# Patient Record
Sex: Male | Born: 1965 | Race: White | Hispanic: No | Smoking: Never smoker
Health system: Southern US, Community
[De-identification: ages and names within clinical notes are randomized; demographics above are authoritative.]

## PROBLEM LIST (undated history)

## (undated) DIAGNOSIS — I10 Essential (primary) hypertension: Secondary | ICD-10-CM

## (undated) DIAGNOSIS — E119 Type 2 diabetes mellitus without complications: Secondary | ICD-10-CM

## (undated) DIAGNOSIS — G4730 Sleep apnea, unspecified: Secondary | ICD-10-CM

## (undated) HISTORY — DX: Sleep apnea, unspecified: G47.30

## (undated) HISTORY — PX: ANKLE FRACTURE SURGERY: SHX122

## (undated) HISTORY — PX: OTHER SURGICAL HISTORY: SHX169

## (undated) HISTORY — DX: Essential (primary) hypertension: I10

## (undated) HISTORY — DX: Type 2 diabetes mellitus without complications: E11.9

---

## 2008-09-08 ENCOUNTER — Inpatient Hospital Stay (HOSPITAL_COMMUNITY): Admission: EM | Admit: 2008-09-08 | Discharge: 2008-09-11 | Payer: Self-pay | Admitting: Emergency Medicine

## 2008-09-09 ENCOUNTER — Ambulatory Visit: Payer: Self-pay | Admitting: Orthopedic Surgery

## 2008-09-10 ENCOUNTER — Encounter: Payer: Self-pay | Admitting: Orthopedic Surgery

## 2008-09-13 ENCOUNTER — Encounter: Payer: Self-pay | Admitting: Orthopedic Surgery

## 2008-09-19 ENCOUNTER — Encounter: Payer: Self-pay | Admitting: Orthopedic Surgery

## 2008-09-21 ENCOUNTER — Ambulatory Visit: Payer: Self-pay | Admitting: Orthopedic Surgery

## 2008-09-21 DIAGNOSIS — S82409A Unspecified fracture of shaft of unspecified fibula, initial encounter for closed fracture: Secondary | ICD-10-CM | POA: Insufficient documentation

## 2008-09-28 ENCOUNTER — Telehealth: Payer: Self-pay | Admitting: Orthopedic Surgery

## 2008-10-19 ENCOUNTER — Ambulatory Visit: Payer: Self-pay | Admitting: Orthopedic Surgery

## 2008-10-19 DIAGNOSIS — Z8679 Personal history of other diseases of the circulatory system: Secondary | ICD-10-CM | POA: Insufficient documentation

## 2008-10-24 ENCOUNTER — Ambulatory Visit (HOSPITAL_COMMUNITY): Admission: RE | Admit: 2008-10-24 | Discharge: 2008-10-24 | Payer: Self-pay | Admitting: Orthopedic Surgery

## 2008-10-24 ENCOUNTER — Ambulatory Visit: Payer: Self-pay | Admitting: Orthopedic Surgery

## 2008-10-26 ENCOUNTER — Ambulatory Visit: Payer: Self-pay | Admitting: Orthopedic Surgery

## 2008-11-06 ENCOUNTER — Ambulatory Visit: Payer: Self-pay | Admitting: Orthopedic Surgery

## 2008-12-04 ENCOUNTER — Ambulatory Visit: Payer: Self-pay | Admitting: Orthopedic Surgery

## 2008-12-13 ENCOUNTER — Telehealth: Payer: Self-pay | Admitting: Orthopedic Surgery

## 2009-01-08 ENCOUNTER — Ambulatory Visit: Payer: Self-pay | Admitting: Orthopedic Surgery

## 2009-01-18 ENCOUNTER — Encounter (INDEPENDENT_AMBULATORY_CARE_PROVIDER_SITE_OTHER): Payer: Self-pay | Admitting: *Deleted

## 2009-01-19 ENCOUNTER — Ambulatory Visit (HOSPITAL_COMMUNITY): Admission: RE | Admit: 2009-01-19 | Discharge: 2009-01-19 | Payer: Self-pay | Admitting: Orthopedic Surgery

## 2009-01-19 ENCOUNTER — Ambulatory Visit: Payer: Self-pay | Admitting: Orthopedic Surgery

## 2009-01-23 ENCOUNTER — Ambulatory Visit: Payer: Self-pay | Admitting: Orthopedic Surgery

## 2009-01-30 ENCOUNTER — Ambulatory Visit: Payer: Self-pay | Admitting: Orthopedic Surgery

## 2009-02-19 ENCOUNTER — Ambulatory Visit: Payer: Self-pay | Admitting: Orthopedic Surgery

## 2009-02-23 ENCOUNTER — Encounter: Payer: Self-pay | Admitting: Orthopedic Surgery

## 2009-03-28 ENCOUNTER — Encounter: Payer: Self-pay | Admitting: Orthopedic Surgery

## 2009-07-04 IMAGING — CR DG ANKLE 2V *L*
2 series · 2 of 2 positions shown · non-contrast
Comparison: None

CLINICAL DATA: Fell on ice at [employer].  Deformity

LEFT ANKLE - 2 VIEW

[view not recorded (1 of 2)]
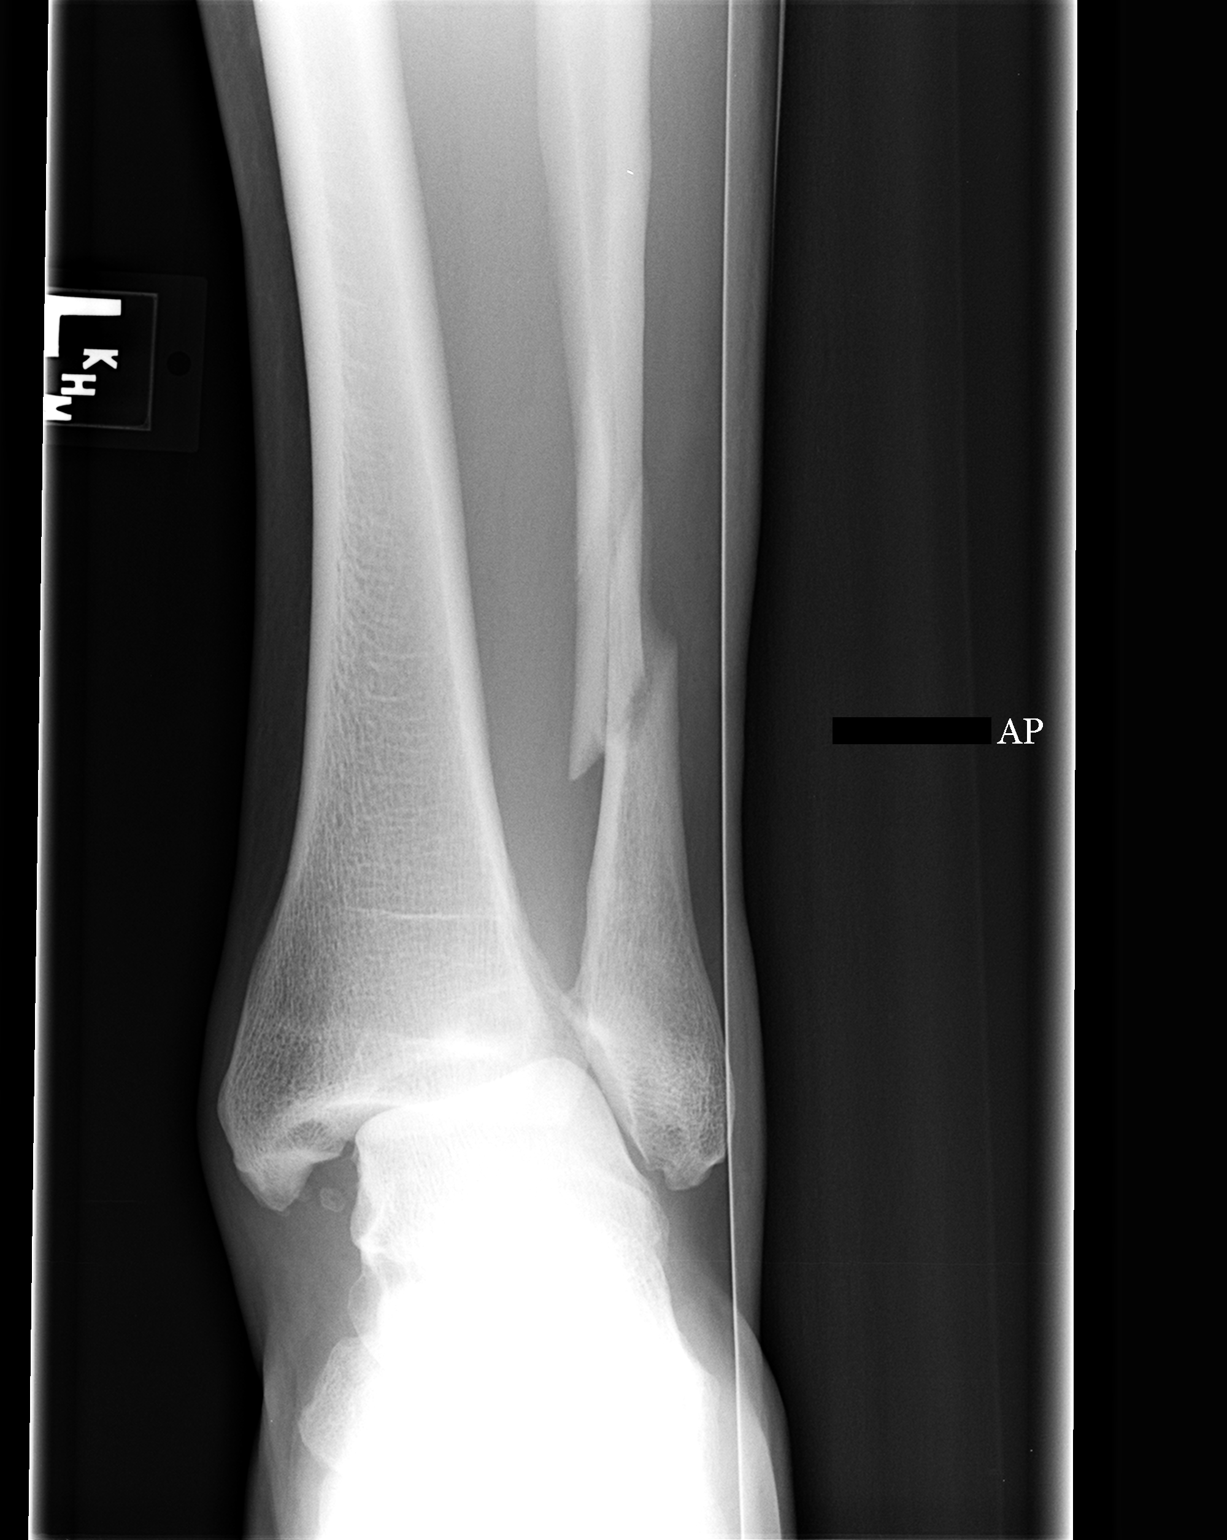

[view not recorded (2 of 2)]
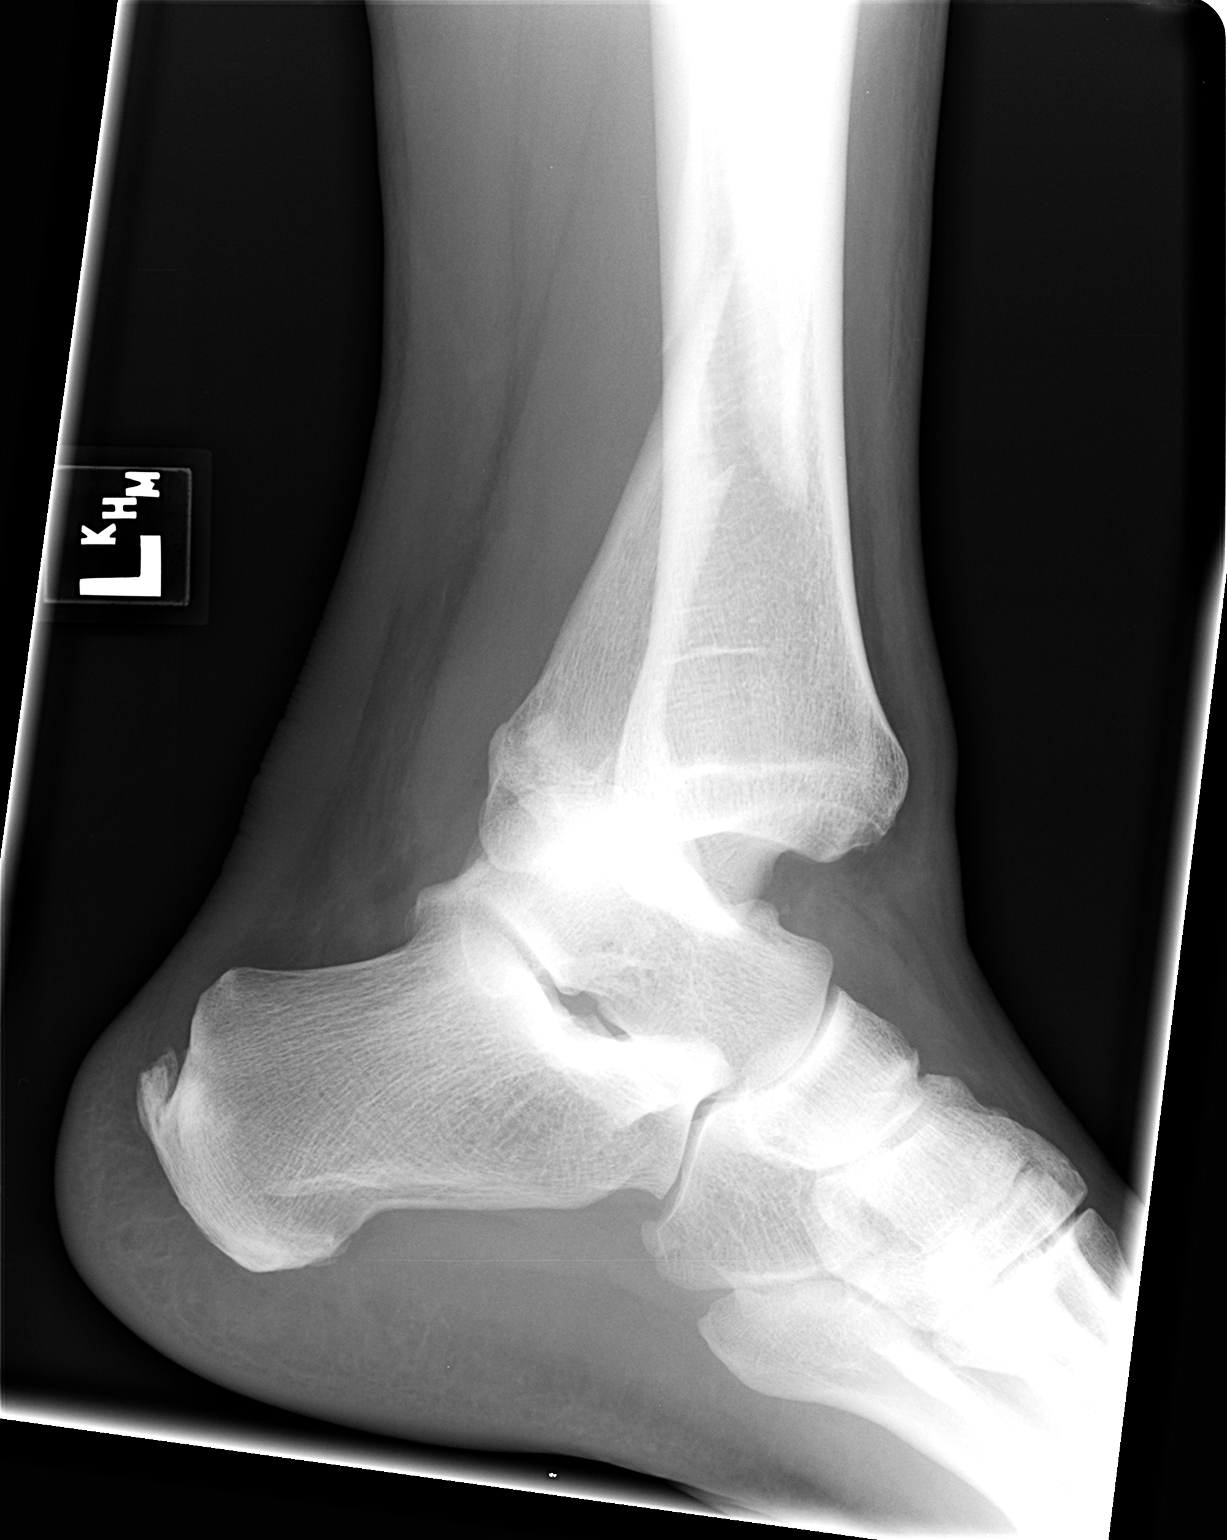

[2 of 2 positions shown; findings below may reference images not displayed]

FINDINGS: There is an oblique, comminuted fracture of the fibula.
There is anterior dislocation of the distal tibia relative to the
talus.  Fracture of the posterior aspect of the tibia cannot be
entirely excluded on the lateral view.  There is significant soft
tissue swelling throughout the ankle. There is an Achilles
calcaneal spur.
IMPRESSION: Fracture dislocation of the ankle.

## 2009-07-04 IMAGING — CR DG ANKLE PORT 2V*L*
2 series · 2 of 2 positions shown · non-contrast
Comparison: Prereduction films of 09/08/2008

CLINICAL DATA: Left ankle injury, post reduction

PORTABLE LEFT ANKLE - 2 VIEW

[view not recorded (1 of 2)]
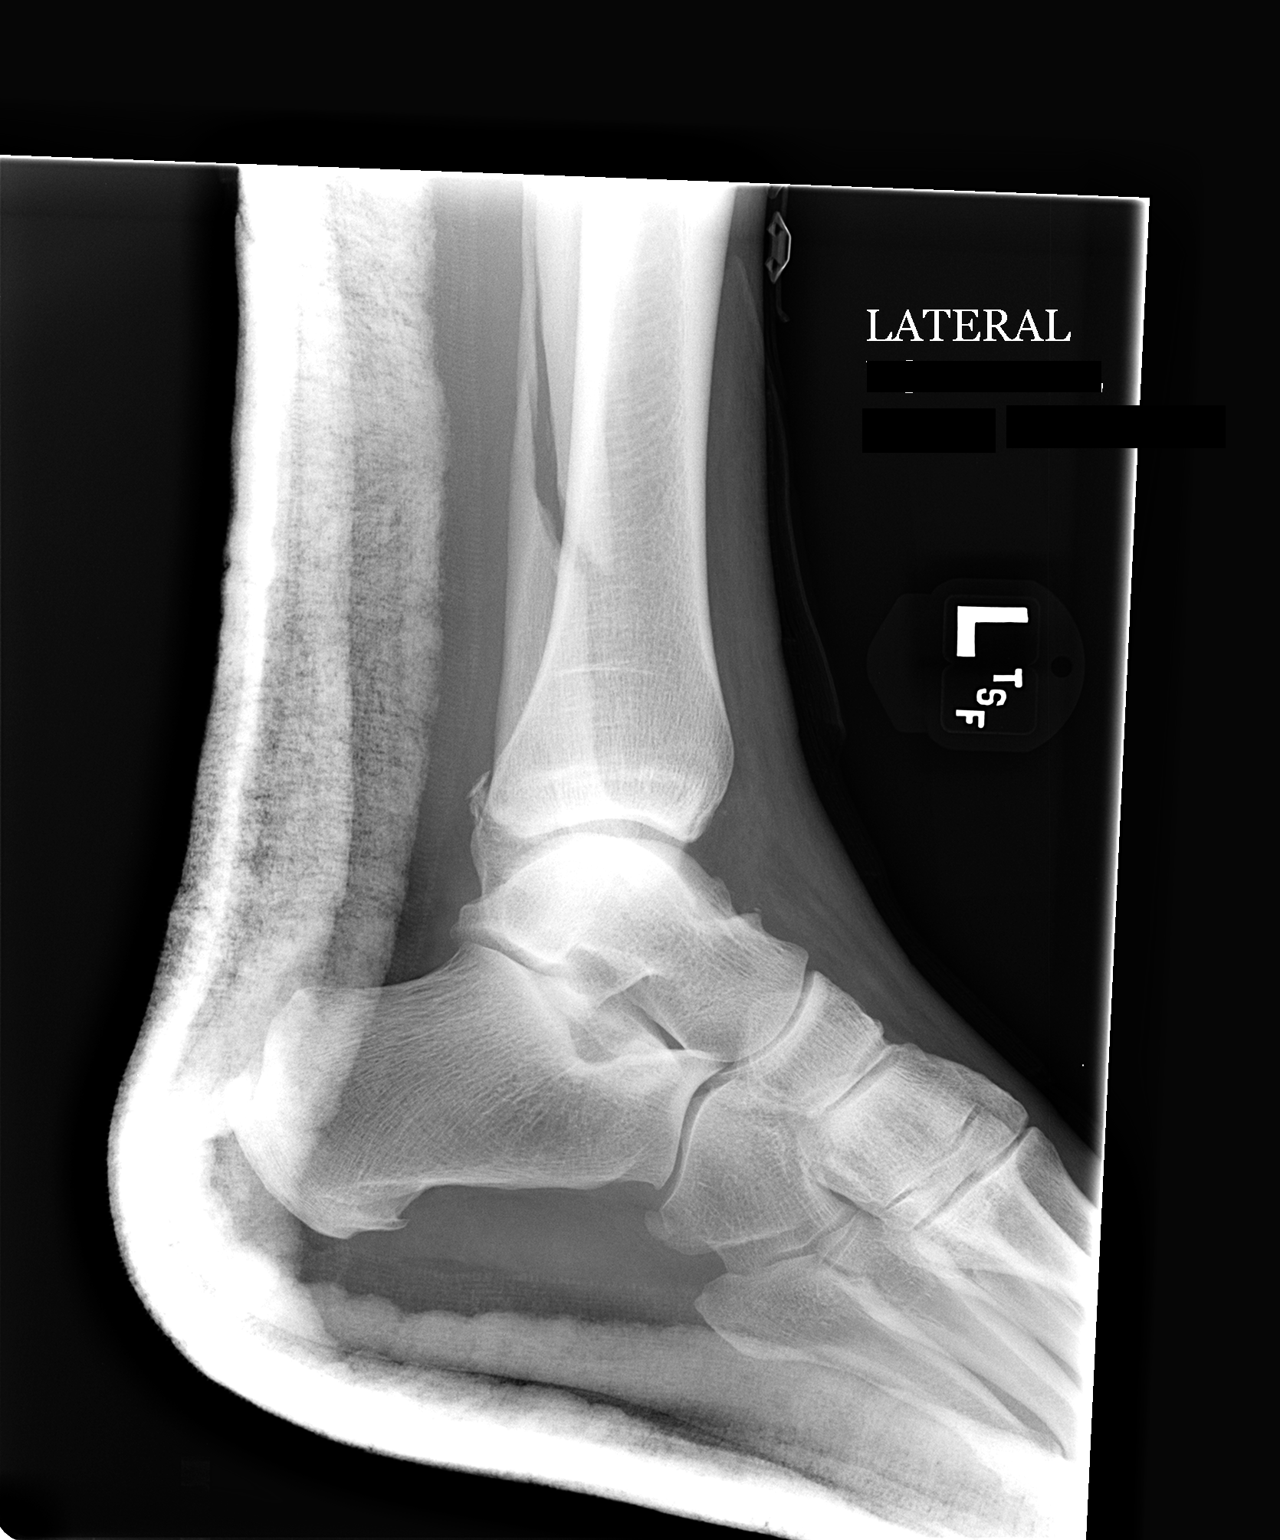

[view not recorded (2 of 2)]
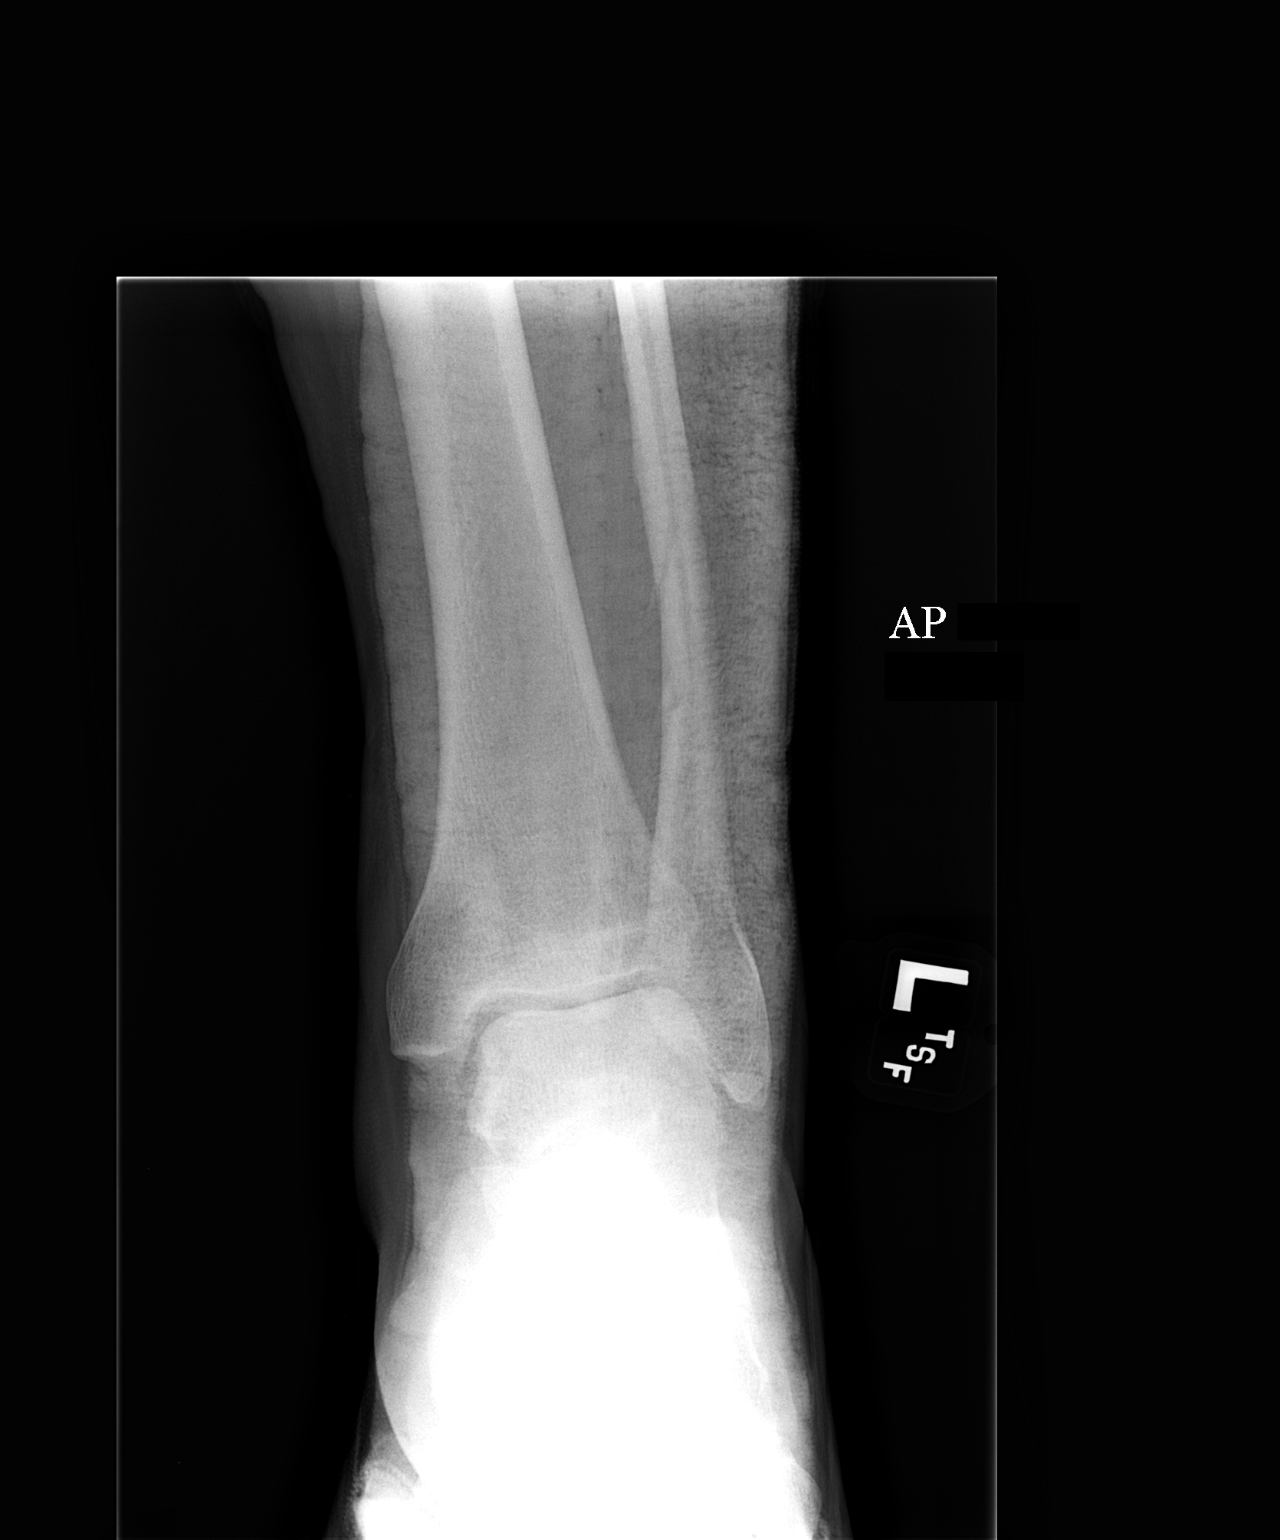

[2 of 2 positions shown; findings below may reference images not displayed]

FINDINGS: The tibiotalar dislocation has been reduced.  An oblique
fracture of the distal left fibula is in better position and
alignment.  The tibiotalar joint space appears normal. There do
appear to be small fracture fragments from the posterior malleolus.
IMPRESSION: Reduction of tibiotalar dislocation with improved position and
alignment of the oblique distal left fibular fracture.
Small fracture fragments from posterior malleolus.

## 2009-11-14 IMAGING — RF DG ANKLE 2V *L*
1 series · 3 of 3 positions shown · non-contrast
Comparison: C-arm spot films of 10/24/2008

CLINICAL DATA: Fixation of prior ankle fracture, for removal of
hardware

LEFT ANKLE - 2 VIEW

[Series 1: run · 3 of 3 slices shown]
[im 1/3]
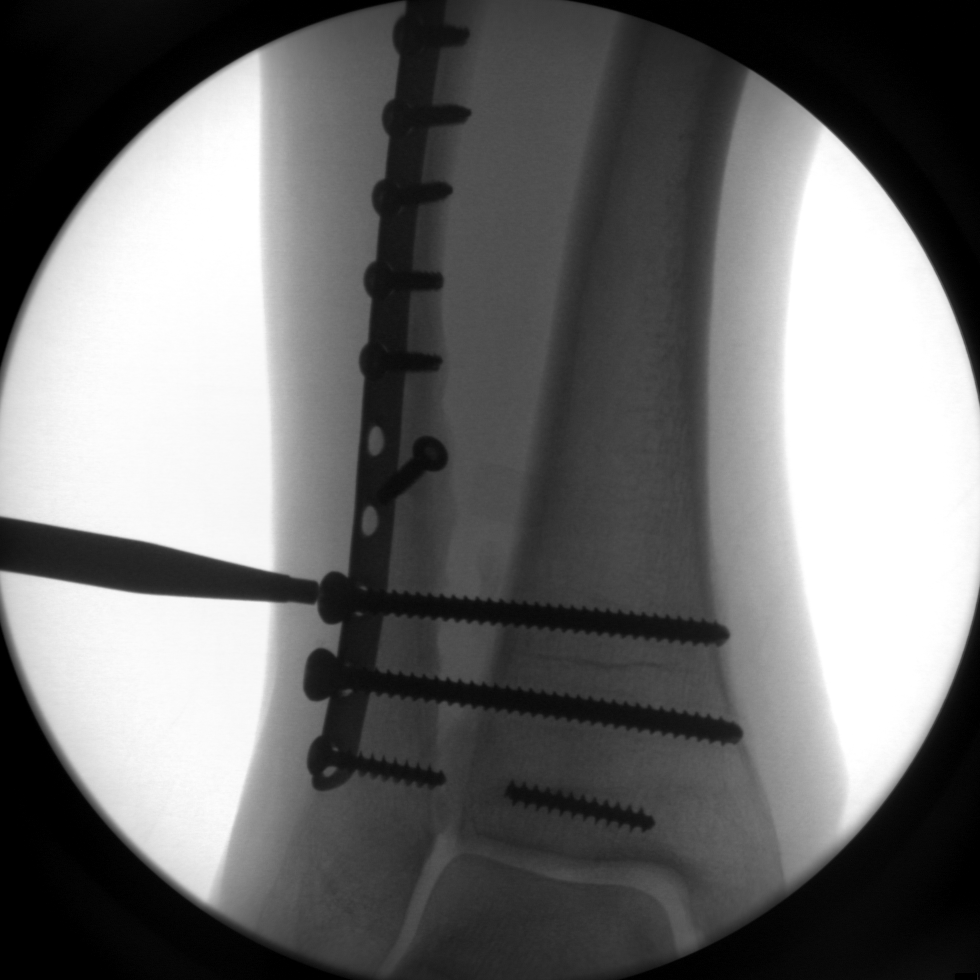
[im 2/3]
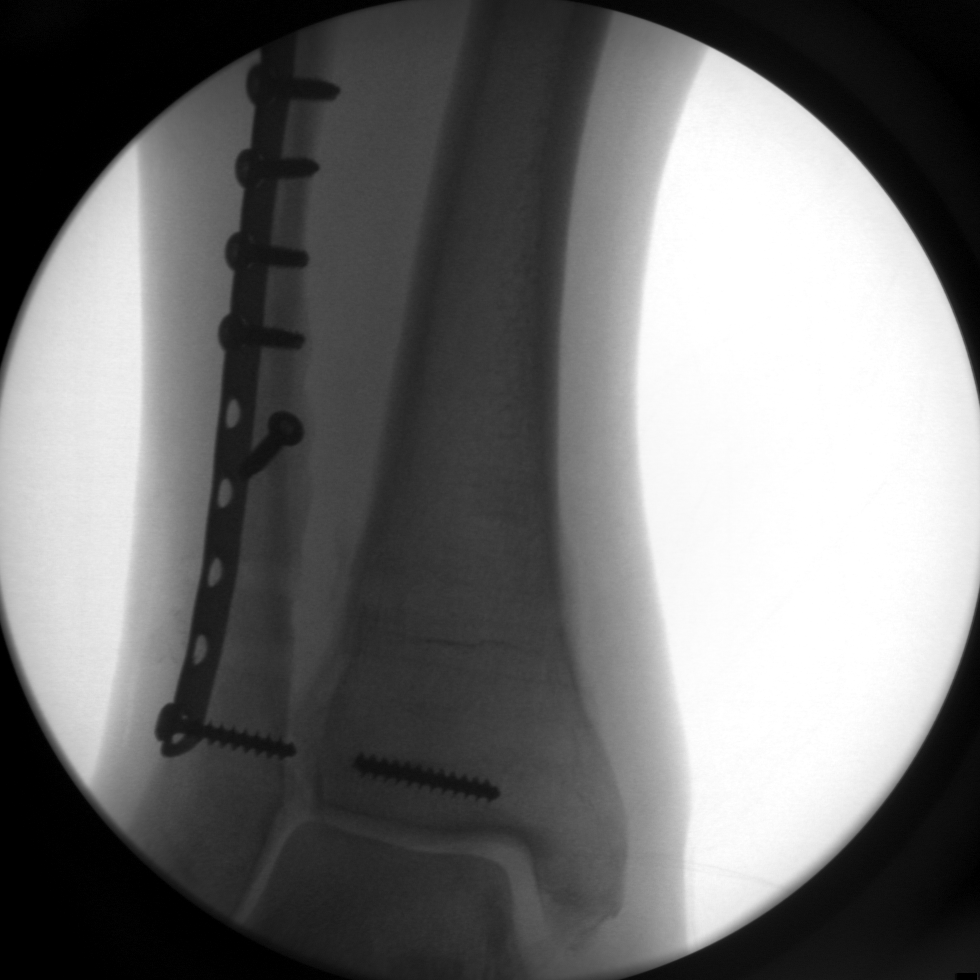
[im 3/3]
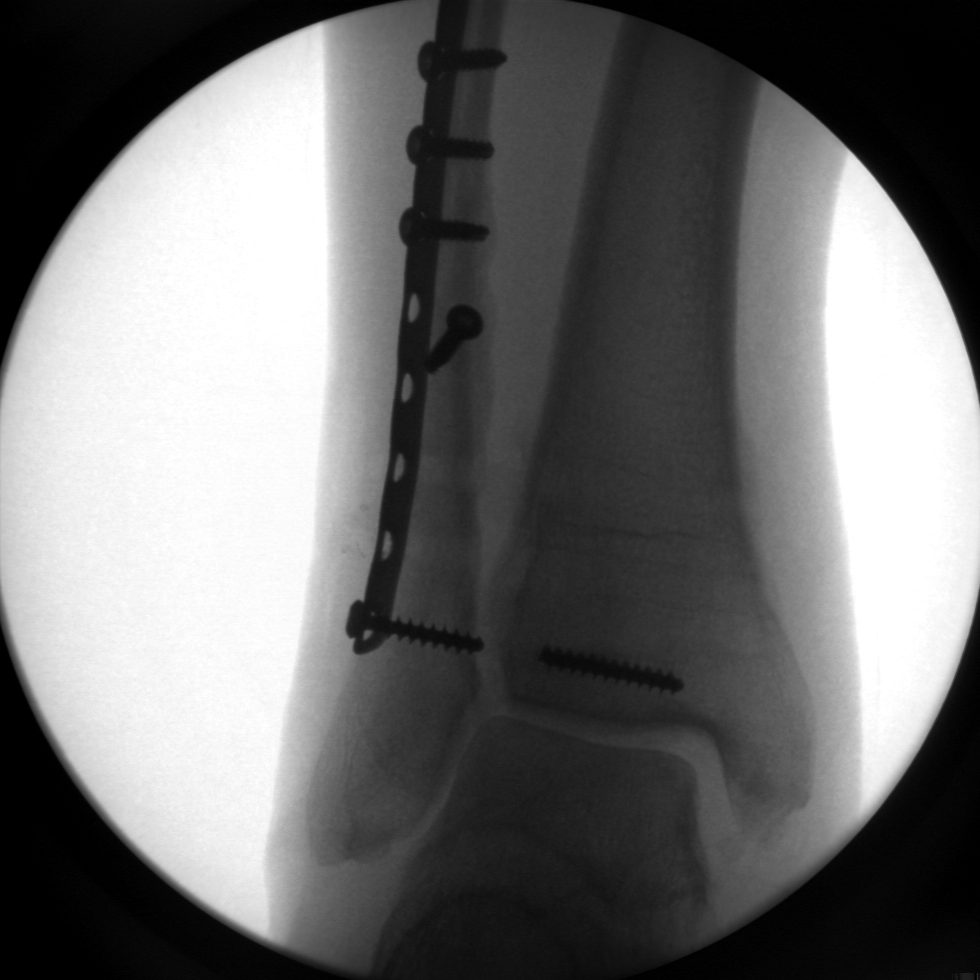

[3 of 3 positions shown; findings below may reference images not displayed]

FINDINGS: The two screws that traversed the distal fibula and tibia
have been removed.  The remainder of the fixation plate and screws,
with the more caudal screw again noted to be fragmented, are
unchanged.
IMPRESSION: Removal of two distal screws which traversed the distal fibula and
tibia.

## 2010-12-10 LAB — BASIC METABOLIC PANEL
BUN: 12 mg/dL (ref 6–23)
GFR calc non Af Amer: >60 mL/min (ref >60)
Potassium: 3.9 mEq/L (ref 3.5–5.1)

## 2010-12-10 LAB — HEMOGLOBIN AND HEMATOCRIT, BLOOD: Hemoglobin: 15.9 g/dL (ref 13.0–17.0)

## 2010-12-16 LAB — BASIC METABOLIC PANEL
BUN: 11 mg/dL (ref 6–23)
Calcium: 9.3 mg/dL (ref 8.4–10.5)
GFR calc non Af Amer: >60 mL/min (ref >60)
Glucose, Bld: 123 mg/dL — ABNORMAL HIGH (ref 70–99)
Sodium: 139 mEq/L (ref 135–145)

## 2010-12-16 LAB — CBC
Hemoglobin: 16.8 g/dL (ref 13.0–17.0)
Platelets: 235 10*3/uL (ref 150–400)
RDW: 13.2 % (ref 11.5–15.5)

## 2010-12-16 LAB — DIFFERENTIAL
Basophils Absolute: 0 10*3/uL (ref 0.0–0.1)
Lymphocytes Relative: 11 % — ABNORMAL LOW (ref 12–46)
Neutro Abs: 9.4 10*3/uL — ABNORMAL HIGH (ref 1.7–7.7)
Neutrophils Relative %: 85 % — ABNORMAL HIGH (ref 43–77)

## 2011-01-14 NOTE — Op Note (Signed)
NAME:  [name], [name] NO.:  [id number]   MEDICAL RECORD NO.:  [id number]          PATIENT TYPE:  AMB   LOCATION:  DAY                           FACILITY:  [facility]   PHYSICIAN:  Vickki Hearing, M.D.DATE OF BIRTH:  10-13-1965   DATE OF PROCEDURE:  DATE OF DISCHARGE:                               OPERATIVE REPORT   This is a 45 year old male who fractured his left ankle on September 09, 2008, at a local store.  He came in for open treatment and internal  fixation of the left fibula with syndesmosis screw and did well until  postop day #40, and on x-ray, he was found to have a broken syndesmosis  screw despite no weightbearing.  He was advised that he return for  revision of his hardware secondary to widening of medial clear space.   PREOPERATIVE DIAGNOSIS:  Fracture left ankle with broken hardware.   POSTOPERATIVE DIAGNOSIS:  Fracture left ankle with broken hardware.   PROCEDURE:  Hardware revision with removal of the broken distal screw,  reinsertion of a 4.0 cancellous screw and then removal of the second to  the last and third to the last cortical screws and placement of two 4.5  fully threaded cortical screws with 4 cortical crutches.   SURGEON:  Vickki Hearing, MD   ASSISTANT:  Fouke Nation.   ANESTHESIA:  General.   OPERATIVE FINDINGS:  The syndesmosis appear to be intact, but the  deltoid ligament appeared to be torn on stress views.   DETAILS:  The patient was identified as Anthony Fry.  Left ankle marked  for surgery.  Surgical site countersigned by surgeon.  The patient taken  to Surgery for general anesthetic.  After sterile prep and drape, time-  out procedure was initiated and completed.   Tourniquet elevated to 350 mmHg.   Using the same incision, the distal portion was opened.  The broken  screw was removed.  The hole was re-measured, and a 4.0 cancellous screw  was placed with good purchase.   The next screw approximately was  removed, and a 3-2 drill bit was used  to drill across the syndesmosis and enter the tibia with 4 cortices  purchase, and a 4-5 screw was placed measuring 66 mm.  This was repeated  on the next screw up, which was repaired from the end.   We took stress views prior to removing any of the hardware and found the  talus to be rotating in the mortise.   With the 2 syndesmosis screws in place, the stress views were repeated,  and the syndesmosis appeared to be stable.  I think, the patient  probably has deltoid ligament tear accounting for this rotation.   With the foot in dorsiflexion and a perfect mortise view, the mortise  was reduced.   The wound was irrigated and closed with 2-0 Monocryl and interrupted 3-0  nylon sutures.  We injected 30 mL of Marcaine in the subcu area with  epinephrine.  We then applied a sterile dressing and released the  tourniquet.  Postop plan is for  him to be nonweightbearing.  He will be  in a brace.  He will follow up in 2 days.  Discharged on Lorcet Plus,  #60 with 2 refills.  Wound check when he returns to the office.      Vickki Hearing, M.D.  Electronically Signed     SEH/MEDQ  D:  10/24/2008  T:  10/25/2008  Job:  [id number]

## 2011-01-14 NOTE — Discharge Summary (Signed)
NAME:  Anthony Fry, Anthony Fry NO.:  [id number]   MEDICAL RECORD NO.:  [id number]          PATIENT TYPE:  INP   LOCATION:  A304                          FACILITY:  [facility]   PHYSICIAN:  Vickki Hearing, M.D.DATE OF BIRTH:  [birth year]   DATE OF ADMISSION:  09/08/2008  DATE OF DISCHARGE:  01/11/2010LH                               DISCHARGE SUMMARY   ADMITTING DIAGNOSIS:  Fracture dislocation of the left ankle Weber C.   DISCHARGE DIAGNOSES:  Fracture dislocation of the left ankle Weber C.  Additionally, he has a history of hypertension, untreated for the last 6-  8 months.   He was admitted by Dr. Romeo Apple and discharged by the same.   CONSULTATIONS:  Obtained through [facility] with Dr. Elige Radon and Dr.  Lilian Kapur for his hypertension.   HISTORY:  He fell at the [facility] parking lot on the day  of admission.  He had a deformed ankle which is neurovascularly intact  on his presentation to the emergency room.  He was evaluated in the  emergency room, had closed reduction application of the posterior splint  and was admitted for preop medical consultation.  His blood pressure was  slightly elevated and he was placed on Prinivil, started at 10 mg and  now it is 20 mg and also added Norvasc 5 mg.   On the September 09, 2008, underwent an open treatment and internal  fixation with a Synthes small fragment screw along with addition of a  syndesmotic screw and then, the next day, he had physical therapy.   He did well.  He ambulated with a walker nonweightbearing in a short leg  cast.  He was scheduled for discharge with rolling walker,  nonweightbearing.  Followup schedule for Thursday 21, 2010, for a cast  off dressing change, x-ray, and application of walking boot, plus or  minus removal of staples in the skin.   DISCHARGE MEDICINES:  1. Norvasc 5 mg daily.  2. Prinivil 20 mg daily.  3. Norco 5/325 1-2 tablets q.4 p.r.n. for pain. #90 with 2 refills.  He was also given a prescription for rolling walker.   His overall condition has improved.   DISPOSITION:  To home.   He needs to follow up with his primary care physician to manage his  hypertension.      Vickki Hearing, M.D.  Electronically Signed     SEH/MEDQ  D:  09/11/2008  T:  09/11/2008  Job:  [id number]

## 2011-01-14 NOTE — Op Note (Signed)
NAME:  DISHON, KEHOE NO.:  [id number]   MEDICAL RECORD NO.:  [id number]          PATIENT TYPE:  AMB   LOCATION:  DAY                           FACILITY:  [facility]   PHYSICIAN:  Vickki Hearing, M.D.DATE OF BIRTH:  [birth year]   DATE OF PROCEDURE:  01/19/2009  DATE OF DISCHARGE:                               OPERATIVE REPORT   HISTORY:  This is a 45 year old male, he had open treatment internal  fixation left fibula with syndesmosis screw on September 09, 2008.  He was  noncompliant and ambulated on the syndesmosis screw and fractured it.  On February 23, had revision of his hardware and placement of 2  syndesmosis screws and now comes back for removal.  Recent radiographs  show fracture has healed and mortise is intact and anatomically aligned.  He has no pain in his ankle at this time.   PREOPERATIVE DIAGNOSIS:  Fracture left ankle.   POSTOPERATIVE DIAGNOSIS:  Fracture left ankle.   PROCEDURE:  Removal of hardware left ankle 2 syndesmosis screws.   SURGEON:  Vickki Hearing, MD.   ASSISTANTS:  No assistants.   ANESTHESIA:  General.   SPECIMENS:  None.  Although, 2 screws were save for the patient, there  were no complications.  There was no blood loss and no tourniquet used.   The patient identification site marking were performed in the preop  area.  The patient was given a gram of Ancef, taken to surgery for  general anesthesia, left leg was prepped with DuraPrep and draped  sterilely.  Time-out procedure initiated and completed.   C-arm was brought and then incision was made over the 2 screws.  The 2  screws were removed.  The wound was irrigated and closed with 2-0  Monocryl and staples, and injected 17 mL of 0.5% Sensorcaine with  epinephrine, and applied a sterile dressing.   The patient can ambulate weightbearing as tolerated and a Cam walker for  3 weeks.  He is advised to not do any strenuous activity.  He will  followup on Tuesday.   Discharged with Lorcet Plus #40 1-4 p.r.n. for  pain.  No refills.      Vickki Hearing, M.D.  Electronically Signed     SEH/MEDQ  D:  01/19/2009  T:  01/19/2009  Job:  [id number]

## 2011-01-14 NOTE — Group Therapy Note (Signed)
NAME:  Anthony Fry, Anthony Fry NO.:  [id number]   MEDICAL RECORD NO.:  [id number]          PATIENT TYPE:  INP   LOCATION:  A304                          FACILITY:  [facility]   PHYSICIAN:  Dorris Singh, DO    DATE OF BIRTH:  Jun 06, 1966   DATE OF PROCEDURE:  09/10/2008  DATE OF DISCHARGE:                                 PROGRESS NOTE   The patient had an episode of hypertension last night for which he was  started on Catapres 1 p.o. daily.  Discussed with the patient his  noncompliance with his medication.  He states he has been off of his  medication for awhile.  Will give him a prescription for the quinapril  again.  He said that would be fine.  He had an issue with being able to  afford it, so will start with that one.  Will increase his dosage from  10-20.   VITALS FOR TODAY ARE AS FOLLOWS:  Temperature 98.4, pulse 82,  respirations 12, blood pressure 155/95.  GENERAL:  He is well-developed, well-nourished.  HEART:  Regular rate and rhythm.  LUNGS:  Clear to auscultation bilaterally.  EXTREMITIES:  Left lower extremity in cast.   ASSESSMENT/PLAN:  Hypertension, uncontrolled.  Will start patient back  on his medication which he is on now.  Will also cover him for any  spikes in his temperature.  Will recommend that he follows up with his  primary care physician.  Will also add Norvasc or amlodipine 5 mg to his  regimen as well as increasing his quinapril.      Dorris Singh, DO  Electronically Signed     CB/MEDQ  D:  09/10/2008  T:  09/10/2008  Job:  [id number]

## 2011-01-14 NOTE — Consult Note (Signed)
NAME:  Anthony Fry, CALVEY NO.:  [id number]   MEDICAL RECORD NO.:  [id number]          PATIENT TYPE:  INP   LOCATION:  A304                          FACILITY:  [facility]   PHYSICIAN:  Skeet Latch, DO    DATE OF BIRTH:  12-27-1965   DATE OF CONSULTATION:  DATE OF DISCHARGE:                                 CONSULTATION   CHIEF COMPLAINT:  Ankle injury.   HISTORY OF PRESENT ILLNESS:  This is a 45 year old Caucasian male who  presents with ankle pain.  Apparently, the patient slipped on the ice in  the parking lot of [employer] and injured his left ankle.  The patient presents with pain and swelling to his left ankle.  The  patient states the pain was 8-10 at its worse.  The patient states that  the pain is aggravated by activity, walking, and any movements.  Upon  being seen in the emergency room, x-ray taken of his ankle showed a  fracture dislocation of the ankle.   PAST MEDICAL HISTORY:  Hypertension.   SURGICAL HISTORY:  None.   SOCIAL HISTORY:  Social drinker.  No history of smoking or illicit drug  use.   ALLERGIES:  No known drug allergies.   FAMILY HISTORY:  Positive for lung cancer.  His mother has lung cancer.   MEDICATIONS:  Currently none.   REVIEW OF SYSTEMS:  CONSTITUTIONAL:  No fever, weight loss, or weight  gain.  HEENT:  Unremarkable.  CARDIOVASCULAR:  Unremarkable.  RESPIRATORY:  Unremarkable.  GASTROINTESTINAL:  Unremarkable.  GENITOURINARY:  Unremarkable.  SKIN:  Unremarkable.  NEUROLOGIC:  Unremarkable.  PSYCHIATRIC:  Unremarkable.  METABOLIC:  Unremarkable.  HEMATOLOGY:  Unremarkable.  MUSCULOSKELETAL:  Positive for swelling and  pain, left ankle.   PHYSICAL EXAMINATION:  VITAL SIGNS:  Temperature 98.3, pulse 89,  respirations 20, blood pressure 157/95.  GENERAL:  The patient is awake, alert, and oriented x3.  Well nourished,  well developed, well hydrated, in no acute distress.  HEENT:  Head is atraumatic and normocephalic.   No scleral icterus.  Eyes  are PERRLA.  EOMI.  NECK:  Soft, supple, nontender, and nondistended.  CARDIOVASCULAR:  Regular rate and rhythm.  No murmurs, rubs, or gallops.  LUNGS:  Clear to auscultation bilaterally.  No rales, rhonchi, or  wheezing.  ABDOMEN:  Soft, nontender, and nondistended.  No masses.  No rebound  tenderness.  No rigidity or guarding.  Positive bowel sounds.  EXTREMITIES:  No clubbing, cyanosis, or edema.  The patient's left ankle  is currently wrapped in Ace bandage.  NEUROLOGIC:  Cranial nerves II through XII are grossly intact.  The  patient is alert and oriented x3.  SKIN:  No rashes.  Petechiae are noted.  PSYCHIATRIC:  Mood is normal.   LABORATORY DATA:  Sodium 139, potassium 4.4, chloride 103, CO2 is 29,  glucose 123, BUN 11, creatinine 1.07, white count 11.1, hemoglobin 16.8,  hematocrit 51.2, and platelet count 235.   RADIOLOGIC STUDIES:  Chest x-ray showed cardiac enlargement.  No heart  failure.  The patient had reduction of his  left ankle.  Repeat x-ray  showed reduction of tibiotalar dislocation with poor position and  alignment of the oblique distal left fibular fracture and small fracture  fragments from posterior malleolus.   ASSESSMENT AND PLAN:  1. Left ankle fracture dislocation with reduction.  Orthopedic Surgery      is to take the patient to surgery, possibly in the morning.  So,      the patient is at low risk for any complications.  The only history      is hypertension.  His blood pressure is elevated, this could be      secondary to the patient not being on his medication for over 6      months.  At this time, we will add his previous medication, may      take it for his blood pressure to come within normal limits.  This      blood pressure medication will still need to be adjusted post      surgery.  2. Hypertension.  As mentioned above, we will place the patient on his      previous medication, and it will need to be adjusted after  the      patient's surgery and after his discharge by his primary care      physician.   Again, the patient seemed to be at low risk for any complications.  I  believe the Orthopedic Surgery can proceed with surgery at this time.   I appreciate the consultation.      Skeet Latch, DO  Electronically Signed     SM/MEDQ  D:  09/09/2008  T:  09/09/2008  Job:  [id number]

## 2011-01-14 NOTE — Op Note (Signed)
NAME:  Anthony Fry, Anthony Fry NO.:  [id number]   MEDICAL RECORD NO.:  [id number]          PATIENT TYPE:  INP   LOCATION:  A304                          FACILITY:  [facility]   PHYSICIAN:  Vickki Hearing, M.D.DATE OF BIRTH:  04/27/1966   DATE OF PROCEDURE:  09/09/2008  DATE OF DISCHARGE:                               OPERATIVE REPORT   He is 45 years old.  He fell on the 8th at [facility].  He  presented with a deformity of his left ankle.  X-ray showed a fracture  dislocation with a Weber C injury, it was closed.  He had a closed  reduction in the emergency room and was admitted.  He has hypertension.  He has not taken high blood pressure medicine for approximately 6 or 8  months.  We got a preop medical consult, they cleared him for surgery,  and he came down to the OR today for OTIF of the left ankle.   PREOPERATIVE DIAGNOSIS:  Closed left Weber C ankle fracture.   POSTOPERATIVE DIAGNOSIS:  Closed left Weber C ankle fracture.   PROCEDURE:  Open treatment and internal fixation of the left ankle with  a 12-hole plate using 10 screws, one of which was a 4.0 cancellous  syndesmotic screws and 3 cortices.   SURGEON:  Vickki Hearing, MD   ASSISTANT:  None.   ANESTHETIC:  Spinal.   OPERATIVE FINDINGS:  A comminuted fracture of the fibula, approximately  40% proximal to the ankle mortise.  The syndesmotic ligaments were  visually ruptured, and there was comminution on the posterior part of  the fibula.   TOURNIQUET TIME:  63 minutes.   The patient had an application of a short-leg cast with the foot in  neutral position.   Preoperative antibiotics were given prior to skin incision.   DETAILS OF PROCEDURE:  Proper site marking was performed, countersigned,  and the patient went to the operating suite where he had a spinal  anesthetic in the supine position with bumps under his left hip, his  left leg was prepped and draped using DuraPrep and  sterile technique.  At that point, a time-out procedure was initiated, completed, and  everyone agreed on the procedure.   As stated, the antibiotics were given prior to exsanguination and  elevation of the tourniquet to .   Straight lateral incision was made.  Subcutaneous tissue was divided in  continuous skin flap down to fibula.  The incision was then bluntly  carried proximally and the bone in the subperiostium was resected.  The  fracture site irrigated, debrided, and manually reduced, no bone flaps.  At this time, a radiograph confirmed reduction of the fracture as well  as the mortise.   A 12-hole plate was contoured to fit on the fibula.   We placed an inner frag screw, which increased the screw count to 11  screws.   We placed the inner frag screw using the following technique.  A 3/5  drill bit was used to drill the proximal screw hole followed by a  countersink.  Golf tee was placed in the hole and then the distal  portion of the screw hole was drilled.  We measured the screw and placed  16-mm cancellous screw and got an excellent fixation and reduction.  Repeat radiographs confirmed as mortise to be reduced, medial clear  space closed, and fracture reduced.   The contoured plate was then applied to the fibula using standard AO  technique.   At the end of the procedure, we took the last screw out and then drilled  across the tib-fib space into the tibia with a 2/5 drill bit with the  foot dorsiflexed to neutral.  We measured 50-mm screw and placed a 4.0,  50-mm cancellous screw and 3 cortices with no compression.   We then repeated our x-rays and found our mortise to be reduced, the  clear space closed, the fracture reduced, took lateral and AP and  mortise x-rays.   We irrigated the wound and closed with 0 Monocryl and staples.  We  injected a total of 50 mL of Marcaine with epinephrine around the wound.  We then applied dressings and a short-leg cast with  4-inch plaster with  the foot in position with the tourniquet down.   We then took the patient to the recovery room in stable condition.   The plan is for postoperative nonweightbearing for 6 weeks, then  protective weightbearing with a brace or cast.  Syndesmosis screw to be  removed at week 12 or later.   He will be in the hospital for gait training, nonweightbearing.       Vickki Hearing, M.D.  Electronically Signed     SEH/MEDQ  D:  09/09/2008  T:  09/10/2008  Job:  [id number]

## 2011-01-14 NOTE — Group Therapy Note (Signed)
NAME:  Anthony Fry, CUDA NO.:  [id number]   MEDICAL RECORD NO.:  [id number]          PATIENT TYPE:  INP   LOCATION:  A304                          FACILITY:  [facility]   PHYSICIAN:  Vickki Hearing, M.D.DATE OF BIRTH:  1966/04/04   DATE OF PROCEDURE:  DATE OF DISCHARGE:                                 PROGRESS NOTE   Postop day #1, status post open treatment and internal fixation of the  left ankle and application of short leg cast.  The patient is  comfortable, resting well, was out of bed today with a walker.  He will  be able to be discharged home tomorrow.  Cast is intact.  Neurovascular  exam is normal.      Vickki Hearing, M.D.  Electronically Signed     SEH/MEDQ  D:  09/10/2008  T:  09/11/2008  Job:  [id number]

## 2011-01-14 NOTE — H&P (Signed)
NAME:  Anthony Fry, Anthony Fry NO.:  [id number]   MEDICAL RECORD NO.:  [id number]          PATIENT TYPE:  INP   LOCATION:  A304                          FACILITY:  [facility]   PHYSICIAN:  Vickki Hearing, M.D.DATE OF BIRTH:  07-12-1966   DATE OF ADMISSION:  09/08/2008  DATE OF DISCHARGE:  LH                              HISTORY & PHYSICAL   CHIEF COMPLAINT:  Left ankle pain.   HISTORY:  This is a 45 year old male who is a hypertensive and has not  taken his blood pressure medicine for approximately 6 or 8 months  because he does not have medical insurance, who presents after falling  in the [facility] parking lot, presented with a deformed  ankle, but neurovascular intact.  He had x-rays which showed a fracture  dislocation of the ankle with a Weber C injury and subluxation of the  talus from the distal tibia, complained of severe pain and deformity.   His past family, social history, and review of systems indicates that he  does indeed have hypertension.  He is not had any major surgery other  than oral surgery, and he does not take his blood pressure medicine.   His vital signs were stable.  He was awake, alert, and oriented x3. His  mood and affect were normal.  He had normal sensation in his foot with  good pulses.  The foot was deformed with external rotation and this was  corrected via closed reduction and he was placed on a splint.  A post  reduction film did show reduction to the talus and tibia on the lateral  with some subluxation secondary to the fibular injury, which is a Weber  C injury.   His upper extremities were normal with no contracture, subluxation,  atrophy, or tremor.  The right lower extremity was normal as well.  He  had no other injuries.  Abdomen was soft.  Heart rate and rhythm were  normal.   Radiographs pre and post reduction as described.   DIAGNOSES:  Closed right fracture dislocation of the ankle, Weber C  injury, status  post closed reduction and application of short-leg  splint.   The patient will have a preop medical consult and then we will have  surgery with open treatment and internal fixation with syndesmosis screw  and plate fixation.   I have reviewed the risks and benefits of the procedure with him and he  understands that the complications most likely to occur are infection,  ankle stiffness, and pain with his hardware.     Vickki Hearing, M.D.  Electronically Signed    SEH/MEDQ  D:  09/08/2008  T:  09/09/2008  Job:  [id number]

## 2011-01-21 ENCOUNTER — Encounter: Payer: Self-pay | Admitting: Orthopedic Surgery

## 2011-01-21 ENCOUNTER — Ambulatory Visit (INDEPENDENT_AMBULATORY_CARE_PROVIDER_SITE_OTHER): Payer: Managed Care, Other (non HMO) | Admitting: Orthopedic Surgery

## 2011-01-21 VITALS — HR 80 | Resp 18 | Ht 74.0 in | Wt 276.0 lb

## 2011-01-21 DIAGNOSIS — M7660 Achilles tendinitis, unspecified leg: Secondary | ICD-10-CM

## 2011-01-21 DIAGNOSIS — M6788 Other specified disorders of synovium and tendon, other site: Secondary | ICD-10-CM | POA: Insufficient documentation

## 2011-01-21 NOTE — Progress Notes (Signed)
A 45 year old male history of hypertension status post open treatment internal fixation LEFT ankle presents with 6-8 months of pain in his Achilles on the RIGHT.  Complains of a sharp dull burning pain which is intermittent seems to be worse when he is on his feet.  When he does not have on shoes such as flip-flops there is no pain.  The pain is located on the back of the heel at the Achilles insertion.  He also has a nodular deformity in the watershed area of the Achilles.  He takes Aleve 4 tablets in the morning.  He was given an anti-inflammatory for 30 days which also helped but is not sure of the name of that.   The patient denies anorexia, fever, weight loss,, vision loss, decreased hearing, hoarseness, chest pain, syncope, dyspnea on exertion, peripheral edema, balance deficits, hemoptysis, abdominal pain, melena, hematochezia, severe indigestion/heartburn, hematuria, incontinence, genital sores, muscle weakness, suspicious skin lesions, transient blindness, difficulty walking, depression, unusual weight change, abnormal bleeding, enlarged lymph nodes, angioedema, and breast masses. General: The patient is normally developed, with normal grooming and hygiene. There are no gross deformities. The body habitus is normal   CDV: The pulse and perfusion of the extremities are normal   LYMPH: There is no gross lymphadenopathy in the extremities   Skin: There are no rashes, ulcers or cafe-au-lait spot   Psyche: The patient is alert, awake and oriented.  Mood is normal   Neuro:  The coordination and balance are normal.  Sensation is normal. Reflexes are 2+ and equal   Musculoskeletal  Ambulation is normal.  Skin over the RIGHT heel and foot is normal strength is normal ankle stability normal range of motion normal.  There is tenderness over the bone of the Achilles insertion into the heel as well as a nodular deformity proximal to that at the watershed area.  X-rays show spur at the Achilles  insertion.  Impression Achilles tendinitis/tendinosis  Pertinent open back shoe x4 weeks, split the leave dosing to 12 hours.  After 4 weeks place normal shoes back on and if symptoms continue come back for discussion of removal of spur

## 2011-01-21 NOTE — Patient Instructions (Signed)
Try aleve 2 in am 2 in pm

## 2011-01-21 NOTE — Progress Notes (Signed)
3 views of the RIGHT ankle  Reason for x-ray to pain in the Achilles  There is a spur at the Achilles insertion.  Otherwise ankle joint normal.  Impression ankle spur in the Achilles insertion consistent with tendinitis or chronic tendinitis

## 2014-05-18 ENCOUNTER — Encounter: Payer: BC Managed Care – PPO | Attending: Family Medicine

## 2014-05-18 VITALS — Ht 74.0 in | Wt 298.2 lb

## 2014-05-18 DIAGNOSIS — Z713 Dietary counseling and surveillance: Secondary | ICD-10-CM | POA: Insufficient documentation

## 2014-05-18 DIAGNOSIS — E119 Type 2 diabetes mellitus without complications: Secondary | ICD-10-CM | POA: Diagnosis not present

## 2014-05-19 NOTE — Progress Notes (Signed)

## 2014-05-25 DIAGNOSIS — E119 Type 2 diabetes mellitus without complications: Secondary | ICD-10-CM

## 2014-05-26 NOTE — Progress Notes (Signed)

## 2014-06-01 ENCOUNTER — Encounter: Payer: BC Managed Care – PPO | Attending: Family Medicine

## 2014-06-01 DIAGNOSIS — E119 Type 2 diabetes mellitus without complications: Secondary | ICD-10-CM | POA: Diagnosis not present

## 2014-06-01 DIAGNOSIS — Z713 Dietary counseling and surveillance: Secondary | ICD-10-CM | POA: Diagnosis not present

## 2014-06-14 NOTE — Progress Notes (Signed)
Patient was seen on 06/01/14 for the third of a series of three diabetes self-management courses at the [facility]. The following learning objectives were met by the patient during this class:    State the amount of activity recommended for healthy living   Describe activities suitable for individual needs   Identify ways to regularly incorporate activity into daily life   Identify barriers to activity and ways to over come these barriers  Identify diabetes medications being personally used and their primary action for lowering glucose and possible side effects   Describe role of stress on blood glucose and develop strategies to address psychosocial issues   Identify diabetes complications and ways to prevent them  Explain how to manage diabetes during illness   Evaluate success in meeting personal goal   Establish 2-3 goals that they will plan to diligently work on until they return for the  1-monthfollow-up visit  Goals:   I will count my carb choices at most meals and snacks  I will be active  3 times a week  Your patient has identified these potential barriers to change:  Finances Stress  Your patient has identified their diabetes self-care support plan as  [facility] Support Plan:  Attend Core 4 in 4 months

## 2014-10-02 ENCOUNTER — Ambulatory Visit: Payer: Managed Care, Other (non HMO)

## 2019-06-24 ENCOUNTER — Other Ambulatory Visit: Payer: Self-pay | Admitting: *Deleted

## 2019-06-24 DIAGNOSIS — Z20822 Contact with and (suspected) exposure to covid-19: Secondary | ICD-10-CM

## 2019-06-26 LAB — NOVEL CORONAVIRUS, NAA: SARS-CoV-2, NAA: NOT DETECTED

## 2019-06-27 ENCOUNTER — Telehealth: Payer: Self-pay | Admitting: General Practice

## 2019-06-27 NOTE — Telephone Encounter (Signed)
° °  Pt given neg COVID results  °

## 2019-09-01 ENCOUNTER — Other Ambulatory Visit: Payer: Managed Care, Other (non HMO)

## 2023-08-19 DIAGNOSIS — Z6831 Body mass index (BMI) 31.0-31.9, adult: Secondary | ICD-10-CM | POA: Diagnosis not present

## 2023-08-19 DIAGNOSIS — E6609 Other obesity due to excess calories: Secondary | ICD-10-CM | POA: Diagnosis not present

## 2023-08-19 DIAGNOSIS — E1100 Type 2 diabetes mellitus with hyperosmolarity without nonketotic hyperglycemic-hyperosmolar coma (NKHHC): Secondary | ICD-10-CM | POA: Diagnosis not present

## 2023-08-19 DIAGNOSIS — I10 Essential (primary) hypertension: Secondary | ICD-10-CM | POA: Diagnosis not present

## 2023-08-20 LAB — MICROALBUMIN / CREATININE URINE RATIO: Microalb Creat Ratio: 58

## 2023-08-20 LAB — BASIC METABOLIC PANEL WITH GFR
BUN: 18 (ref 4–21)
Creatinine: 0.9 (ref 0.6–1.3)
Glucose: 310

## 2023-08-20 LAB — LIPID PANEL
LDL Cholesterol: 205
Triglycerides: 180 — AB (ref 40–160)

## 2023-08-20 LAB — HEMOGLOBIN A1C: Hemoglobin A1C: 13.8

## 2023-08-20 LAB — COMPREHENSIVE METABOLIC PANEL WITH GFR: eGFR: 99

## 2023-08-20 LAB — TSH: TSH: 0.01 — AB (ref 0.41–5.90)

## 2023-10-14 DIAGNOSIS — Z6832 Body mass index (BMI) 32.0-32.9, adult: Secondary | ICD-10-CM | POA: Diagnosis not present

## 2023-10-14 DIAGNOSIS — E1100 Type 2 diabetes mellitus with hyperosmolarity without nonketotic hyperglycemic-hyperosmolar coma (NKHHC): Secondary | ICD-10-CM | POA: Diagnosis not present

## 2023-10-14 DIAGNOSIS — E6609 Other obesity due to excess calories: Secondary | ICD-10-CM | POA: Diagnosis not present

## 2023-12-16 NOTE — Patient Instructions (Signed)

## 2023-12-17 ENCOUNTER — Encounter: Payer: Self-pay | Admitting: Nurse Practitioner

## 2023-12-17 ENCOUNTER — Ambulatory Visit: Payer: Self-pay | Admitting: Nurse Practitioner

## 2023-12-17 ENCOUNTER — Ambulatory Visit (INDEPENDENT_AMBULATORY_CARE_PROVIDER_SITE_OTHER): Payer: Self-pay | Admitting: Nurse Practitioner

## 2023-12-17 VITALS — BP 138/80 | HR 70 | Ht 74.5 in | Wt 241.8 lb

## 2023-12-17 DIAGNOSIS — Z7984 Long term (current) use of oral hypoglycemic drugs: Secondary | ICD-10-CM

## 2023-12-17 DIAGNOSIS — R7989 Other specified abnormal findings of blood chemistry: Secondary | ICD-10-CM

## 2023-12-17 DIAGNOSIS — I10 Essential (primary) hypertension: Secondary | ICD-10-CM | POA: Diagnosis not present

## 2023-12-17 DIAGNOSIS — E1165 Type 2 diabetes mellitus with hyperglycemia: Secondary | ICD-10-CM

## 2023-12-17 DIAGNOSIS — E782 Mixed hyperlipidemia: Secondary | ICD-10-CM | POA: Diagnosis not present

## 2023-12-17 LAB — POCT GLYCOSYLATED HEMOGLOBIN (HGB A1C): Hemoglobin A1C: 6.2 % — AB (ref 4.0–5.6)

## 2023-12-17 NOTE — Progress Notes (Addendum)
 Endocrinology Consult Note       12/17/2023, 3:19 PM   Subjective:    Patient ID: Anthony Fry, male    DOB: 1965/12/20.  Anthony Fry is being seen in consultation for management of currently uncontrolled symptomatic diabetes requested by  Minus Amel, MD.   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    Employer: [employer]  Tobacco Use   Smoking status: Never   Smokeless tobacco: Not on file  Substance and Sexual Activity   Alcohol use: Yes    Comment: 2 times per week   Drug use: Not on file   Sexual activity: Not on file  Other Topics Concern   Not on file  Social History Narrative   Not on file   Social Drivers of Health   Financial Resource Strain: Not on file  Food Insecurity: Not on file  Transportation Needs: Not on file  Physical Activity: Not on file  Stress: Not on file  Social Connections: Not on file    Family History  Problem Relation Age of Onset   Cancer Other     Outpatient Encounter Medications as of 12/17/2023  Medication Sig   amLODipine (NORVASC) 5 MG tablet Take 5 mg by mouth daily.   LISINOPRIL PO Take by mouth.     lisinopril-hydrochlorothiazide (ZESTORETIC) 20-25 MG tablet Take 1 tablet by mouth daily.   metFORMIN (GLUCOPHAGE) 1000 MG tablet Take 1,000 mg by mouth 2 (two) times daily.   Continuous Glucose Sensor (FREESTYLE LIBRE 14 DAY SENSOR) MISC Apply topically 2 (two) times daily as needed. (Patient  not taking: Reported on 12/17/2023)   [DISCONTINUED] naproxen sodium (ANAPROX) 220 MG tablet Take 220 mg by mouth 2 (two) times daily with a meal.   (Patient not taking: Reported on 12/17/2023)   No facility-administered encounter medications on file as of 12/17/2023.    ALLERGIES: No Known Allergies  VACCINATION STATUS:  There is no immunization history on file for this patient.  Diabetes He presents for his initial diabetic visit. He has type 2 diabetes mellitus. Onset time: newly diagnosed at age 58. His disease course has been improving. There are no hypoglycemic associated symptoms. Associated symptoms include blurred vision, fatigue, polydipsia, polyuria and weight loss. There are no hypoglycemic complications. Symptoms are resolved. There are no diabetic complications. Risk factors for coronary artery disease include diabetes mellitus, dyslipidemia, family history, obesity, male sex and hypertension. Current diabetic treatment includes oral agent (monotherapy). He is compliant with treatment most of the time. His weight is decreasing steadily. He is following a high fat/cholesterol (low carb) diet. When asked about  meal planning, he reported none. He has not had a previous visit with a dietitian. He participates in exercise intermittently. His home blood glucose trend is decreasing steadily. (He presents today for his consultation with his meter showing at target glycemic profile.  His POCT A1c today is 6.2%, improving drastically from A1c in December (at time of diagnosis) at 13.8%.  He has a CGM at home and alternates between using that and fingersticks.  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  He notes he eats 2-3 meals per day and snacks while in the truck on celery, nuts, etc.  He does not engage in routine physical activity.  He is UTD on eye exam, has never seen podiatry in the past.) An ACE  inhibitor/angiotensin II receptor blocker is being taken. He does not see a podiatrist.Eye exam is current.     Review of systems  Constitutional: + decreasing body weight, current Body mass index is 30.63 kg/m., no fatigue, + hot/cold fluctuations Eyes: no blurry vision, no xerophthalmia ENT: no sore throat, no nodules palpated in throat, no dysphagia/odynophagia, no hoarseness Cardiovascular: no chest pain, no shortness of breath, no palpitations, no leg swelling Respiratory: no cough, no shortness of breath Gastrointestinal: no nausea/vomiting/diarrhea Musculoskeletal: no muscle/joint aches Skin: no rashes, no hyperemia Neurological: no tremors, no numbness, no tingling, no dizziness Psychiatric: no depression, no anxiety, + irritability, + insomnia (controlled on OTC meds)  Objective:     BP 138/80 (BP Location: Left Arm, Patient Position: Sitting, Cuff Size: Large)   Pulse 70   Ht 6' 2.5" (1.892 m)   Wt 241 lb 12.8 oz (109.7 kg)   BMI 30.63 kg/m   Wt Readings from Last 3 Encounters:  12/17/23 241 lb 12.8 oz (109.7 kg)  05/19/14 298 lb 3.2 oz (135.3 kg)  01/21/11 (!) 276 lb (125.2 kg)     BP Readings from Last 3 Encounters:  12/17/23 138/80     Physical Exam- Limited  Constitutional:  Body mass index is 30.63 kg/m. , not in acute distress, normal state of mind Eyes:  EOMI, no exophthalmos Neck: Supple Cardiovascular: RRR, no murmurs, rubs, or gallops, no edema Respiratory: Adequate breathing efforts, no crackles, rales, rhonchi, or wheezing Musculoskeletal: no gross deformities, strength intact in all four extremities, no gross restriction of joint movements Skin:  no rashes, no hyperemia Neurological: no tremor with outstretched hands   Diabetic Foot Exam - Simple   No data filed      CMP ( most recent) CMP     Component Value Date/Time   NA 138 01/16/2009 1102   K 3.9 01/16/2009 1102   CL 102 01/16/2009 1102   CO2 30 01/16/2009 1102   GLUCOSE 100  (H) 01/16/2009 1102   BUN 18 08/20/2023 0000   CREATININE 0.9 08/20/2023 0000   CREATININE 0.91 01/16/2009 1102   CALCIUM 9.3 01/16/2009 1102   EGFR 99 08/20/2023 0000   GFRNONAA >60 01/16/2009 1102     Diabetic Labs (most recent): Lab Results  Component Value Date   HGBA1C 6.2 (A) 12/17/2023   HGBA1C 13.8 08/20/2023     Lipid Panel ( most recent) Lipid Panel     Component Value Date/Time   TRIG 180 (A) 08/20/2023 0000   LDLCALC 205 08/20/2023 0000      Lab Results  Component Value Date   TSH 0.01 (A) 08/20/2023  Assessment & Plan:   1) Type 2 diabetes mellitus with hyperglycemia, without long-term current use of insulin (HCC) (Primary)  He presents today for his consultation with his meter showing at target glycemic profile.  His POCT A1c today is 6.2%, improving drastically from A1c in December (at time of diagnosis) at 13.8%.  He has a CGM at home and alternates between using that and fingersticks.  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  He notes he eats 2-3 meals per day and snacks while in the truck on celery, nuts, etc.  He does not engage in routine physical activity.  He is UTD on eye exam, has never seen podiatry in the past.  - Anthony Fry has newly diagnosed type 2 DM since 57 years of age, with most recent A1c of 6.2 %.   -Recent labs reviewed.  - I had a long discussion with him about the progressive nature of diabetes and the pathology behind its complications. -his diabetes is not currently complicated but he remains at a high risk for more acute and chronic complications which include CAD, CVA, CKD, retinopathy, and neuropathy. These are all discussed in detail with him.  The following Lifestyle Medicine recommendations according to American College of Lifestyle Medicine [facility]) were discussed and offered to patient and he agrees to start the journey:   A. Whole Foods, Plant-based plate comprising of fruits and vegetables, plant-based proteins, whole-grain carbohydrates was discussed in detail with the patient.   A list for source of those nutrients were also provided to the patient.  Patient will use only water or unsweetened tea for hydration. B.  The need to stay away from risky substances including alcohol, smoking; obtaining 7 to 9 hours of restorative sleep, at least 150 minutes of moderate intensity exercise weekly, the importance of healthy social connections,  and stress reduction techniques were discussed. C.  A full color page of  Calorie density of various food groups per pound showing examples of each food groups was provided to the patient.  - I have counseled him on diet and weight management by adopting a carbohydrate restricted/protein rich diet. Patient is encouraged to switch to unprocessed or minimally processed complex starch and increased protein intake (animal or plant source), fruits, and vegetables. -  he is advised to stick to a routine mealtimes to eat 3 meals a day and avoid unnecessary snacks (to snack only to correct hypoglycemia).   - he acknowledges that there is a room for improvement in his food and drink choices. - Suggestion is made for him to avoid simple carbohydrates from his diet including Cakes, Sweet Desserts, Ice Cream, Soda (diet and regular), Sweet Tea, Candies, Chips, Cookies, Store Bought Juices, Alcohol in Excess of 1-2 drinks a day, Artificial Sweeteners, Coffee Creamer, and "Sugar-free" Products. This will help patient to have more stable blood glucose profile and potentially avoid unintended weight gain.  - I have approached him with the following individualized plan to manage his diabetes and patient agrees:   -he is encouraged to start/continue monitoring glucose 1 times daily, before breakfast and to call the clinic if he has readings less than 70 or above 300 for 3 tests in a row.  - Adjustment  parameters are given to him for hypo and hyperglycemia in writing.  - he is advised to lower Metformin to 500 mg po twice daily (may take  0.5 of his current 1000 mg tabs), therapeutically suitable for patient.  There is a chance he can come off medications totally and manage with lifestyle modifications.  - Specific targets for  A1c; LDL, HDL, and Triglycerides were discussed with the patient.  2) Blood Pressure /Hypertension:  his blood pressure is controlled to target.   he is advised to continue his current medications as prescribed by his PCP.  3) Lipids/Hyperlipidemia:    Review of his recent lipid panel from 08/20/23 showed uncontrolled LDL at 205 and elevated triglycerides of 180 . He is not currently on any lipid lowering medications but is scheduled to see Dr. Glady Laming to discuss in the next few weeks.  4)  Weight/Diet:  his Body mass index is 30.63 kg/m.  -  clearly complicating his diabetes care.   he is a candidate for weight loss. I discussed with him the fact that loss of 5 - 10% of his  current body weight will have the most impact on his diabetes management.  Exercise, and detailed carbohydrates information provided  -  detailed on discharge instructions.  5) Chronic Care/Health Maintenance: -he is on ACEI/ARB and Statin medications and is encouraged to initiate and continue to follow up with Ophthalmology, Dentist, Podiatrist at least yearly or according to recommendations, and advised to stay away from smoking. I have recommended yearly flu vaccine and pneumonia vaccine at least every 5 years; moderate intensity exercise for up to 150 minutes weekly; and sleep for at least 7 hours a day.  6) Abnormal TSH His recent TSH level in December 2024 was low at 0.006.  He does not have any personal or family history of thyroid  problems, no hx of thyroid  cancer.  He has never had any imaging of his thyroid  in the past.  He denies any Biotin use- has small amount in his mens MVI.  He has  never taken any medications for his thyroid  in the past.  Initially he did have some issues with unexplained weight loss (but this was also at the time he was diagnosed with diabetes too).  He notes great improvement in his symptoms.  Will repeat more comprehensive thyroid  panel including antibody testing to assess for autoimmune thyroid  dysfunction.  Will call patient with the results and next steps.   - he is advised to maintain close follow up with Minus Amel, MD for primary care needs, as well as his other providers for optimal and coordinated care.   - Time spent in this patient care: 75 min was spent in counseling him about his diabetes and the rest reviewing his blood glucose logs, discussing his hypoglycemia and hyperglycemia episodes, reviewing his current and previous labs/studies (including abstraction from other facilities) and medications doses and developing a long term treatment plan based on the latest standards of care/guidelines; and documenting his care.    Please refer to Patient Instructions for Blood Glucose Monitoring and Insulin/Medications Dosing Guide" in media tab for additional information. Please also refer to "Patient Self Inventory" in the Media tab for reviewed elements of pertinent patient history.  Anthony Fry participated in the discussions, expressed understanding, and voiced agreement with the above plans.  All questions were answered to his satisfaction. he is encouraged to contact clinic should he have any questions or concerns prior to his return visit.     Follow up plan: - Return in about 4 months (around 04/17/2024) for Diabetes F/U with A1c in office, Thyroid  follow up, Previsit labs.  Hulon Magic, [facility] [address] [place], [place] [address] Phone: [phone] Fax: [phone]  12/17/2023, 3:19 PM

## 2023-12-18 LAB — THYROID PEROXIDASE ANTIBODY: Thyroperoxidase Ab SerPl-aCnc: 11 [IU]/mL (ref 0–34)

## 2023-12-18 LAB — TSH: TSH: 0.011 u[IU]/mL — ABNORMAL LOW (ref 0.450–4.500)

## 2023-12-18 LAB — T4, FREE: Free T4: 1.37 ng/dL (ref 0.82–1.77)

## 2023-12-18 LAB — T3, FREE: T3, Free: 4 pg/mL (ref 2.0–4.4)

## 2023-12-18 LAB — THYROGLOBULIN ANTIBODY: Thyroglobulin Antibody: <1 [IU]/mL (ref 0.0–0.9)

## 2023-12-21 ENCOUNTER — Other Ambulatory Visit: Payer: Self-pay | Admitting: Nurse Practitioner

## 2023-12-21 ENCOUNTER — Encounter: Payer: Self-pay | Admitting: Nurse Practitioner

## 2023-12-21 DIAGNOSIS — R7989 Other specified abnormal findings of blood chemistry: Secondary | ICD-10-CM

## 2023-12-21 NOTE — Progress Notes (Signed)
 I just sent it this morning, we just need to give him some time.

## 2023-12-24 ENCOUNTER — Encounter: Payer: Self-pay | Admitting: *Deleted

## 2024-01-11 ENCOUNTER — Telehealth: Payer: Self-pay | Admitting: *Deleted

## 2024-01-11 NOTE — Telephone Encounter (Signed)
  Procedure: COLONOSCOPY  Estimated body mass index is 30.63 kg/m as calculated from the following:   Height as of 12/17/23: 6' 2.5" (1.892 m).   Weight as of 12/17/23: 241 lb 12.8 oz (109.7 kg).   Have you had a colonoscopy before?  NO  Do you have family history of colon cancer?  NO  Do you have a family history of polyps? NO  Previous colonoscopy with polyps removed? NO  Do you have a history colorectal cancer?   NO  Are you diabetic?  YES TYPE 2  Do you have a prosthetic or mechanical heart valve? NO  Do you have a pacemaker/defibrillator?   NO  Have you had endocarditis/atrial fibrillation?  NO  Do you use supplemental oxygen/CPAP?  YES  Have you had joint replacement within the last 12 months?  NO  Do you tend to be constipated or have to use laxatives?  NO   Do you have history of alcohol use? If yes, how much and how often.  NO  Do you have history or are you using drugs? If yes, what do are you  using?  NO  Have you ever had a stroke/heart attack?  NO  Have you ever had a heart or other vascular stent placed,?NO  Do you take weight loss medication? NO  Do you take any blood-thinning medications such as: (Plavix, aspirin, Coumadin, Aggrenox, Brilinta, Xarelto, Eliquis, Pradaxa, Savaysa or Effient)? NO  If yes we need the name, milligram, dosage and who is prescribing doctor:               Current Outpatient Medications  Medication Sig Dispense Refill   amLODipine (NORVASC) 5 MG tablet Take 5 mg by mouth daily.     LISINOPRIL PO Take by mouth.       lisinopril-hydrochlorothiazide (ZESTORETIC) 20-25 MG tablet Take 1 tablet by mouth daily.     metFORMIN (GLUCOPHAGE) 1000 MG tablet Take 1,000 mg by mouth 2 (two) times daily.     No current facility-administered medications for this visit.    No Known Allergies

## 2024-01-22 NOTE — Telephone Encounter (Signed)
 ASA 3 (OSA on CPAP) - ok room 1/2  Hold metformin night prior and morning of procedure

## 2024-01-27 NOTE — Telephone Encounter (Signed)
 LMOVM to call back

## 2024-02-09 NOTE — Telephone Encounter (Signed)
 Wife called and said she would call back to schedule bc they have to check schedules.

## 2024-04-15 LAB — T3, FREE: T3, Free: 3.6 pg/mL (ref 2.0–4.4)

## 2024-04-15 LAB — TSH: TSH: 0.094 u[IU]/mL — ABNORMAL LOW (ref 0.450–4.500)

## 2024-04-15 LAB — T4, FREE: Free T4: 1.19 ng/dL (ref 0.82–1.77)

## 2024-04-20 ENCOUNTER — Encounter: Payer: Self-pay | Admitting: Nurse Practitioner

## 2024-04-20 ENCOUNTER — Ambulatory Visit (INDEPENDENT_AMBULATORY_CARE_PROVIDER_SITE_OTHER): Admitting: Nurse Practitioner

## 2024-04-20 VITALS — BP 130/80 | HR 71 | Ht 74.5 in | Wt 257.8 lb

## 2024-04-20 DIAGNOSIS — E782 Mixed hyperlipidemia: Secondary | ICD-10-CM

## 2024-04-20 DIAGNOSIS — Z7984 Long term (current) use of oral hypoglycemic drugs: Secondary | ICD-10-CM

## 2024-04-20 DIAGNOSIS — E1165 Type 2 diabetes mellitus with hyperglycemia: Secondary | ICD-10-CM

## 2024-04-20 DIAGNOSIS — R7989 Other specified abnormal findings of blood chemistry: Secondary | ICD-10-CM

## 2024-04-20 DIAGNOSIS — I10 Essential (primary) hypertension: Secondary | ICD-10-CM | POA: Diagnosis not present

## 2024-04-20 DIAGNOSIS — R5382 Chronic fatigue, unspecified: Secondary | ICD-10-CM

## 2024-04-20 LAB — POCT GLYCOSYLATED HEMOGLOBIN (HGB A1C): Hemoglobin A1C: 6.7 % — AB (ref 4.0–5.6)

## 2024-04-20 MED ORDER — METFORMIN HCL 1000 MG PO TABS: 500.0000 mg | ORAL_TABLET | Freq: Two times a day (BID) | ORAL | 3 refills | Status: AC

## 2024-04-20 NOTE — Progress Notes (Signed)
 Endocrinology Consult Note       04/20/2024, 4:14 PM   Subjective:    Patient ID: Anthony Fry, male    DOB: 08/11/1966.  Anthony Fry is being seen in consultation for management of currently uncontrolled symptomatic diabetes requested by  Marvine Rush, MD.   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    Employer: [employer]  Tobacco Use   Smoking status: Never   Smokeless tobacco: Not on file  Substance and Sexual Activity   Alcohol use: Yes    Comment: 2 times per week   Drug use: Not on file   Sexual activity: Not on file  Other Topics Concern   Not on file  Social History Narrative   Not on file   Social Drivers of Health   Financial Resource Strain: Not on file  Food Insecurity: Not on file  Transportation Needs: Not on file  Physical Activity: Not on file  Stress: Not on file  Social Connections: Not on file    Family History  Problem Relation Age of Onset   Cancer Other     Outpatient Encounter Medications as of 04/20/2024  Medication Sig   amLODipine (NORVASC) 5 MG tablet Take 5 mg by mouth daily.   lisinopril-hydrochlorothiazide (ZESTORETIC) 20-25 MG tablet Take 1 tablet by mouth daily.   [DISCONTINUED] metFORMIN  (GLUCOPHAGE ) 1000 MG tablet Take 1,000 mg by mouth 2 (two) times daily. (Patient taking differently: Take 500 mg by mouth 2 (two) times daily with a meal.)   metFORMIN  (GLUCOPHAGE ) 1000 MG tablet Take 0.5 tablets  (500 mg total) by mouth 2 (two) times daily with a meal.   [DISCONTINUED] LISINOPRIL PO Take by mouth.   (Patient not taking: Reported on 04/20/2024)   No facility-administered encounter medications on file as of 04/20/2024.    ALLERGIES: No Known Allergies  VACCINATION STATUS:  There is no immunization history on file for this patient.  Diabetes He presents for his follow-up diabetic visit. He has type 2 diabetes mellitus. Onset time: newly diagnosed at age 63. His disease course has been improving. There are no hypoglycemic associated symptoms. Associated symptoms include blurred vision, fatigue, polydipsia, polyuria and weight loss. There are no hypoglycemic complications. Symptoms are resolved. There are no diabetic complications. Risk factors for coronary artery disease include diabetes mellitus, dyslipidemia, family history, obesity, male sex and hypertension. Current diabetic treatment includes oral agent (monotherapy). He is compliant with treatment most of the time. His weight is fluctuating minimally. He is following a high fat/cholesterol (low carb) diet. When asked about meal planning, he reported none. He has not had  a previous visit with a dietitian. He participates in exercise intermittently. (He presents today with no meter or logs to review.  Insurance denied his request for CGM due to not being on insulin.  His POCT A1c today is 6.7%, increasing slightly from last visit of 6.2%.  His diet had remained the same, mostly beef products and veggies, pretty low carb.  He did quit drinking alcohol since our visit in April.) An ACE inhibitor/angiotensin II receptor blocker is being taken. He does not see a podiatrist.Eye exam is current.     Review of systems  Constitutional: + fluctuating body weight, current Body mass index is 32.66 kg/m., no fatigue Eyes: no blurry vision, no xerophthalmia ENT: no sore throat, no nodules palpated in throat, no dysphagia/odynophagia, no  hoarseness Cardiovascular: no chest pain, no shortness of breath, no palpitations, no leg swelling Respiratory: no cough, no shortness of breath Gastrointestinal: no nausea/vomiting/diarrhea Musculoskeletal: no muscle/joint aches Skin: no rashes, no hyperemia Neurological: no tremors, no numbness, no tingling, no dizziness Psychiatric: no depression, no anxiety, + irritability, + insomnia (controlled on OTC meds)  Objective:     BP 130/80 (BP Location: Right Arm, Patient Position: Sitting, Cuff Size: Large)   Pulse 71   Ht 6' 2.5 (1.892 m)   Wt 257 lb 12.8 oz (116.9 kg)   BMI 32.66 kg/m   Wt Readings from Last 3 Encounters:  04/20/24 257 lb 12.8 oz (116.9 kg)  12/17/23 241 lb 12.8 oz (109.7 kg)  05/19/14 298 lb 3.2 oz (135.3 kg)     BP Readings from Last 3 Encounters:  04/20/24 130/80  12/17/23 138/80      Physical Exam- Limited  Constitutional:  Body mass index is 32.66 kg/m. , not in acute distress, normal state of mind Eyes:  EOMI, no exophthalmos Musculoskeletal: no gross deformities, strength intact in all four extremities, no gross restriction of joint movements Skin:  no rashes, no hyperemia Neurological: no tremor with outstretched hands   Diabetic Foot Exam - Simple   No data filed      CMP ( most recent) CMP     Component Value Date/Time   NA 138 01/16/2009 1102   K 3.9 01/16/2009 1102   CL 102 01/16/2009 1102   CO2 30 01/16/2009 1102   GLUCOSE 100 (H) 01/16/2009 1102   BUN 18 08/20/2023 0000   CREATININE 0.9 08/20/2023 0000   CREATININE 0.91 01/16/2009 1102   CALCIUM 9.3 01/16/2009 1102   EGFR 99 08/20/2023 0000   GFRNONAA >60 01/16/2009 1102     Diabetic Labs (most recent): Lab Results  Component Value Date   HGBA1C 6.7 (A) 04/20/2024   HGBA1C 6.2 (A) 12/17/2023   HGBA1C 13.8 08/20/2023     Lipid Panel ( most recent) Lipid Panel     Component Value Date/Time   TRIG 180 (A) 08/20/2023 0000   LDLCALC 205 08/20/2023 0000       Lab Results  Component Value Date   TSH 0.094 (L) 04/14/2024   TSH 0.011 (L) 12/17/2023   TSH 0.01 (A) 08/20/2023   FREET4 1.19 04/14/2024   FREET4 1.37 12/17/2023         Latest Reference Range & Units 08/20/23 00:00 12/17/23 14:59 04/14/24 15:41  TSH 0.450 - 4.500 uIU/mL 0.01 ! (E) 0.011 (L) 0.094 (L)  Triiodothyronine,Free,Serum 2.0 - 4.4 pg/mL  4.0 3.6  T4,Free(Direct) 0.82 - 1.77 ng/dL  8.62 8.80  Thyroperoxidase Ab SerPl-aCnc 0 - 34 IU/mL  11   Thyroglobulin Antibody  0.0 - 0.9 IU/mL  <1.0   !: Data is abnormal (L): Data is abnormally low (E): External lab result   Assessment & Plan:   1) Type 2 diabetes mellitus with hyperglycemia, without long-term current use of insulin (HCC) (Primary)  He presents today with no meter or logs to review.  Insurance denied his request for CGM due to not being on insulin.  His POCT A1c today is 6.7%, increasing slightly from last visit of 6.2%.  His diet had remained the same, mostly beef products and veggies, pretty low carb.  He did quit drinking alcohol since our visit in April.  - KEYLIN PODOLSKY has newly diagnosed type 2 DM since 58 years of age.   -Recent labs reviewed.  - I had a long discussion with him about the progressive nature of diabetes and the pathology behind its complications. -his diabetes is not currently complicated but he remains at a high risk for more acute and chronic complications which include CAD, CVA, CKD, retinopathy, and neuropathy. These are all discussed in detail with him.  The following Lifestyle Medicine recommendations according to American College of Lifestyle Medicine [facility]) were discussed and offered to patient and he agrees to start the journey:  A. Whole Foods, Plant-based plate comprising of fruits and vegetables, plant-based proteins, whole-grain carbohydrates was discussed in detail with the patient.   A list for source of those nutrients were also provided to the patient.  Patient will use only  water or unsweetened tea for hydration. B.  The need to stay away from risky substances including alcohol, smoking; obtaining 7 to 9 hours of restorative sleep, at least 150 minutes of moderate intensity exercise weekly, the importance of healthy social connections,  and stress reduction techniques were discussed. C.  A full color page of  Calorie density of various food groups per pound showing examples of each food groups was provided to the patient.  - Nutritional counseling repeated at each appointment due to patients tendency to fall back in to old habits.  - The patient admits there is a room for improvement in their diet and drink choices. -  Suggestion is made for the patient to avoid simple carbohydrates from their diet including Cakes, Sweet Desserts / Pastries, Ice Cream, Soda (diet and regular), Sweet Tea, Candies, Chips, Cookies, Sweet Pastries, Store Bought Juices, Alcohol in Excess of 1-2 drinks a day, Artificial Sweeteners, Coffee Creamer, and Sugar-free Products. This will help patient to have stable blood glucose profile and potentially avoid unintended weight gain.   - I encouraged the patient to switch to unprocessed or minimally processed complex starch and increased protein intake (animal or plant source), fruits, and vegetables.   - Patient is advised to stick to a routine mealtimes to eat 3 meals a day and avoid unnecessary snacks (to snack only to correct hypoglycemia).  - I have approached him with the following individualized plan to manage his diabetes and patient agrees:   - he is advised to continue Metformin  500 mg po twice daily.  Will keep this for now, especially through holiday season.  -he is encouraged to start/continue monitoring glucose 1 times daily, before breakfast and to call the clinic if he has readings less than 70 or above 300 for 3 tests in a row.  - Adjustment parameters are given to him for hypo and hyperglycemia in writing.  - Specific targets  for  A1c; LDL, HDL, and Triglycerides were discussed with the patient.  2) Blood Pressure /  Hypertension:  his blood pressure is controlled to target.   he is advised to continue his current medications as prescribed by his PCP.  3) Lipids/Hyperlipidemia:    Review of his recent lipid panel from 08/20/23 showed uncontrolled LDL at 205 and elevated triglycerides of 180 . He is not currently on any lipid lowering medications but is scheduled to see Dr. Marvine to discuss in the next few weeks.  Will recheck lipid panel prior to next visit.  4)  Weight/Diet:  his Body mass index is 32.66 kg/m.  -  clearly complicating his diabetes care.   he is a candidate for weight loss. I discussed with him the fact that loss of 5 - 10% of his  current body weight will have the most impact on his diabetes management.  Exercise, and detailed carbohydrates information provided  -  detailed on discharge instructions.  5) Chronic Care/Health Maintenance: -he is on ACEI/ARB and Statin medications and is encouraged to initiate and continue to follow up with Ophthalmology, Dentist, Podiatrist at least yearly or according to recommendations, and advised to stay away from smoking. I have recommended yearly flu vaccine and pneumonia vaccine at least every 5 years; moderate intensity exercise for up to 150 minutes weekly; and sleep for at least 7 hours a day.  6) Abnormal TSH- Subclinical hyperthyroidism His recent TSH level in December 2024 was low at 0.006.  He does not have any personal or family history of thyroid  problems, no hx of thyroid  cancer.  He has never had any imaging of his thyroid  in the past.  He denies any Biotin use- has small amount in his mens MVI.  He has never taken any medications for his thyroid  in the past.  Initially he did have some issues with unexplained weight loss (but this was also at the time he was diagnosed with diabetes too).  He notes great improvement in his symptoms.  His recent thyroid   labs show suppressed TSH (however improving), normal Free T4 and Free T3.  His antibody testing was negative ruling out autoimmune thyroid  dysfunction.  He does not need any antithyroid treatment at this time.  Will repeat thyroid  labs prior to next visit.  7) Chronic fatigue Will check testosterone level prior to next visit.  He notes decreased sex drive as well.  - he is advised to maintain close follow up with Marvine Rush, MD for primary care needs, as well as his other providers for optimal and coordinated care.     I spent  54  minutes in the care of the patient today including review of labs from CMP, Lipids, Thyroid  Function, Hematology (current and previous including abstractions from other facilities); face-to-face time discussing  his blood glucose readings/logs, discussing hypoglycemia and hyperglycemia episodes and symptoms, medications doses, his options of short and long term treatment based on the latest standards of care / guidelines;  discussion about incorporating lifestyle medicine;  and documenting the encounter. Risk reduction counseling performed per USPSTF guidelines to reduce obesity and cardiovascular risk factors.     Please refer to Patient Instructions for Blood Glucose Monitoring and Insulin/Medications Dosing Guide  in media tab for additional information. Please  also refer to  Patient Self Inventory in the Media  tab for reviewed elements of pertinent patient history.  Anthony Fry participated in the discussions, expressed understanding, and voiced agreement with the above plans.  All questions were answered to his satisfaction. he is encouraged to contact clinic should he have any  questions or concerns prior to his return visit.     Follow up plan: - Return in about 3 months (around 07/21/2024) for Diabetes F/U with A1c in office, Thyroid  follow up, Previsit labs.    Benton Rio, [facility] [address] [place], [place] [address] Phone: [phone] Fax: [phone]  04/20/2024, 4:14 PM

## 2024-07-27 LAB — COMPREHENSIVE METABOLIC PANEL WITH GFR
ALT: 24 IU/L (ref 0–44)
AST: 13 IU/L (ref 0–40)
Albumin: 4.7 g/dL (ref 3.8–4.9)
Alkaline Phosphatase: 70 IU/L (ref 47–123)
BUN/Creatinine Ratio: 20 (ref 9–20)
BUN: 19 mg/dL (ref 6–24)
Bilirubin Total: 0.4 mg/dL (ref 0.0–1.2)
CO2: 22 mmol/L (ref 20–29)
Calcium: 10.2 mg/dL (ref 8.7–10.2)
Chloride: 99 mmol/L (ref 96–106)
Creatinine, Ser: 0.94 mg/dL (ref 0.76–1.27)
Globulin, Total: 2.4 g/dL (ref 1.5–4.5)
Glucose: 133 mg/dL — ABNORMAL HIGH (ref 70–99)
Potassium: 4.7 mmol/L (ref 3.5–5.2)
Sodium: 140 mmol/L (ref 134–144)
Total Protein: 7.1 g/dL (ref 6.0–8.5)
eGFR: 93 mL/min/1.73 (ref >59)

## 2024-07-27 LAB — TESTOSTERONE FREE, PROFILE I
Sex Hormone Binding: 17.1 nmol/L — ABNORMAL LOW (ref 19.3–76.4)
Testost., Free, Calc: 84.9 pg/mL (ref 35.8–168.2)
Testosterone: 329 ng/dL (ref 264–916)

## 2024-07-27 LAB — LIPID PANEL
Chol/HDL Ratio: 4 ratio (ref 0.0–5.0)
Cholesterol, Total: 188 mg/dL (ref 100–199)
HDL: 47 mg/dL (ref >39)
LDL Chol Calc (NIH): 119 mg/dL — ABNORMAL HIGH (ref 0–99)
Triglycerides: 125 mg/dL (ref 0–149)
VLDL Cholesterol Cal: 22 mg/dL (ref 5–40)

## 2024-07-27 LAB — T4, FREE: Free T4: 1 ng/dL (ref 0.82–1.77)

## 2024-07-27 LAB — VITAMIN D 25 HYDROXY (VIT D DEFICIENCY, FRACTURES): Vit D, 25-Hydroxy: 42.4 ng/mL (ref 30.0–100.0)

## 2024-07-27 LAB — T3, FREE: T3, Free: 3.5 pg/mL (ref 2.0–4.4)

## 2024-07-27 LAB — TSH: TSH: 0.334 u[IU]/mL — ABNORMAL LOW (ref 0.450–4.500)

## 2024-08-01 ENCOUNTER — Ambulatory Visit: Admitting: Nurse Practitioner

## 2024-08-01 ENCOUNTER — Encounter: Payer: Self-pay | Admitting: Nurse Practitioner

## 2024-08-01 VITALS — BP 126/68 | HR 78 | Ht 74.5 in | Wt 260.2 lb

## 2024-08-01 DIAGNOSIS — E782 Mixed hyperlipidemia: Secondary | ICD-10-CM | POA: Diagnosis not present

## 2024-08-01 DIAGNOSIS — I10 Essential (primary) hypertension: Secondary | ICD-10-CM | POA: Diagnosis not present

## 2024-08-01 DIAGNOSIS — Z7984 Long term (current) use of oral hypoglycemic drugs: Secondary | ICD-10-CM

## 2024-08-01 DIAGNOSIS — E1165 Type 2 diabetes mellitus with hyperglycemia: Secondary | ICD-10-CM | POA: Diagnosis not present

## 2024-08-01 LAB — POCT GLYCOSYLATED HEMOGLOBIN (HGB A1C): Hemoglobin A1C: 6.9 % — AB (ref 4.0–5.6)

## 2024-08-01 NOTE — Progress Notes (Signed)
 Endocrinology Follow Up Note       08/01/2024, 4:22 PM   Subjective:    Patient ID: Anthony Fry, male    DOB: 1965/12/16.  Anthony Fry is being seen in follow up after being seen in consultation for management of currently uncontrolled symptomatic diabetes requested by  Marvine Rush, MD.   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    Employer: [employer]  Tobacco Use   Smoking status: Never   Smokeless tobacco: Not on file  Substance and Sexual Activity   Alcohol use: Yes    Comment: 2 times per week   Drug use: Not on file   Sexual activity: Not on file  Other Topics Concern   Not on file  Social History Narrative   Not on file   Social Drivers of Health   Financial Resource Strain: Not on file  Food Insecurity: Not on file  Transportation Needs: Not on file  Physical Activity: Not on file  Stress: Not on file  Social Connections: Not on file    Family History  Problem Relation Age of Onset   Cancer Other     Outpatient Encounter Medications as of 08/01/2024  Medication Sig   amLODipine (NORVASC) 5 MG tablet Take 5 mg by mouth daily.   lisinopril-hydrochlorothiazide (ZESTORETIC) 20-25 MG tablet Take 1 tablet by mouth daily.   metFORMIN  (GLUCOPHAGE ) 1000 MG tablet Take 0.5 tablets (500 mg total) by mouth 2 (two) times daily with a meal.   No facility-administered encounter medications on file as of 08/01/2024.     ALLERGIES: No Known Allergies  VACCINATION STATUS:  There is no immunization history on file for this patient.  Diabetes He presents for his follow-up diabetic visit. He has type 2 diabetes mellitus. Onset time: newly diagnosed at age 58. His disease course has been stable. There are no hypoglycemic associated symptoms. There are no diabetic associated symptoms. There are no hypoglycemic complications. There are no diabetic complications. Risk factors for coronary artery disease include diabetes mellitus, dyslipidemia, family history, obesity, male sex and hypertension. Current diabetic treatment includes oral agent (monotherapy). He is compliant with treatment most of the time. His weight is fluctuating minimally. He is following a high fat/cholesterol (low carb) diet. When asked about meal planning, he reported none. He has not had a previous visit with a dietitian. He participates in exercise intermittently. (He presents today with no meter or logs to review (was not asked to routinely monitor glucose due to safe regimen with Metformin  only).   His POCT A1c today is 6.9%, increasing slightly from last visit of  6.7%.  He admits he has not been as strict with his diet and exercise regimen lately.  He is eating what he wants but in smaller quantities.) An ACE inhibitor/angiotensin II receptor blocker is being taken. He does not see a podiatrist.Eye exam is current.     Review of systems  Constitutional: + increasing body weight, current Body mass index is 32.96 kg/m., no fatigue Eyes: no blurry vision, no xerophthalmia ENT: no sore throat, no nodules palpated in throat, no dysphagia/odynophagia, no hoarseness Cardiovascular: no chest pain, no shortness of breath, no palpitations, no leg swelling Respiratory: no cough, no shortness of breath Gastrointestinal: no nausea/vomiting/diarrhea Musculoskeletal: no muscle/joint aches Skin: no rashes, no hyperemia Neurological: no tremors, no  numbness, no tingling, no dizziness Psychiatric: no depression, no anxiety  Objective:     BP 126/68 (BP Location: Left Arm, Patient Position: Sitting, Cuff Size: Large)   Pulse 78   Ht 6' 2.5 (1.892 m)   Wt 260 lb 3.2 oz (118 kg)   BMI 32.96 kg/m   Wt Readings from Last 3 Encounters:  08/01/24 260 lb 3.2 oz (118 kg)  04/20/24 257 lb 12.8 oz (116.9 kg)  12/17/23 241 lb 12.8 oz (109.7 kg)     BP Readings from Last 3 Encounters:  08/01/24 126/68  04/20/24 130/80  12/17/23 138/80      Physical Exam- Limited  Constitutional:  Body mass index is 32.96 kg/m. , not in acute distress, normal state of mind Eyes:  EOMI, no exophthalmos Musculoskeletal: no gross deformities, strength intact in all four extremities, no gross restriction of joint movements Skin:  no rashes, no hyperemia Neurological: no tremor with outstretched hands   Diabetic Foot Exam - Simple   No data filed      CMP ( most recent) CMP     Component Value Date/Time   NA 140 07/26/2024 0757   K 4.7 07/26/2024 0757   CL 99 07/26/2024 0757   CO2 22 07/26/2024 0757   GLUCOSE 133 (H) 07/26/2024 0757   GLUCOSE 100 (H) 01/16/2009 1102   BUN 19 07/26/2024 0757   CREATININE 0.94 07/26/2024 0757   CALCIUM 10.2 07/26/2024 0757   PROT 7.1 07/26/2024 0757   ALBUMIN 4.7 07/26/2024 0757   AST 13 07/26/2024 0757   ALT 24 07/26/2024 0757   ALKPHOS 70 07/26/2024 0757   BILITOT 0.4 07/26/2024 0757   EGFR 93 07/26/2024 0757   GFRNONAA >60 01/16/2009 1102     Diabetic Labs (most recent): Lab Results  Component Value Date   HGBA1C 6.9 (A) 08/01/2024   HGBA1C 6.7 (A) 04/20/2024   HGBA1C 6.2 (A) 12/17/2023     Lipid Panel ( most recent) Lipid Panel     Component Value Date/Time   CHOL 188 07/26/2024 0757   TRIG 125 07/26/2024 0757   HDL 47 07/26/2024 0757   CHOLHDL 4.0 07/26/2024 0757   LDLCALC 119 (H) 07/26/2024 0757   LABVLDL 22 07/26/2024 0757      Lab Results  Component Value Date   TSH  0.334 (L) 07/26/2024   TSH 0.094 (L) 04/14/2024   TSH 0.011 (L) 12/17/2023   TSH 0.01 (A) 08/20/2023   FREET4 1.00 07/26/2024   FREET4 1.19 04/14/2024   FREET4 1.37 12/17/2023         Latest Reference Range & Units 08/20/23 00:00 12/17/23 14:59 04/14/24 15:41 07/26/24 07:57  TSH 0.450 - 4.500 uIU/mL 0.01 ! (E) 0.011 (L) 0.094 (L) 0.334 (L)  Triiodothyronine,Free,Serum 2.0 - 4.4 pg/mL  4.0 3.6 3.5  T4,Free(Direct) 0.82 - 1.77 ng/dL  8.62 8.80 8.99  Thyroperoxidase Ab SerPl-aCnc 0 - 34 IU/mL  11    Thyroglobulin Antibody 0.0 - 0.9 IU/mL  <1.0    !: Data is abnormal (L): Data is abnormally low (E): External lab result   Assessment & Plan:   1) Type 2 diabetes mellitus with hyperglycemia, without long-term current use of insulin (HCC) (Primary)  He presents today with no meter or logs to review (was not asked to routinely monitor glucose due to safe regimen with Metformin  only).   His POCT A1c today is 6.9%, increasing slightly from last visit of 6.7%.  He admits he has not been as strict with his diet and exercise regimen lately.  He is eating what he wants but in smaller quantities.  - Anthony Fry has newly diagnosed type 2 DM since 58 years of age.   -Recent labs reviewed.  - I had a long discussion with him about the progressive nature of diabetes and the pathology behind its complications. -his diabetes is not currently complicated but he remains at a high risk for more acute and chronic complications which include CAD, CVA, CKD, retinopathy, and neuropathy. These are all discussed in detail with him.  The following Lifestyle Medicine recommendations according to American College of Lifestyle Medicine [facility]) were discussed and offered to patient and he agrees to start the journey:  A. Whole Foods, Plant-based plate comprising of fruits and vegetables, plant-based proteins, whole-grain carbohydrates was discussed in detail with the patient.   A list for source of those nutrients  were also provided to the patient.  Patient will use only water or unsweetened tea for hydration. B.  The need to stay away from risky substances including alcohol, smoking; obtaining 7 to 9 hours of restorative sleep, at least 150 minutes of moderate intensity exercise weekly, the importance of healthy social connections,  and stress reduction techniques were discussed. C.  A full color page of  Calorie density of various food groups per pound showing examples of each food groups was provided to the patient.  - Nutritional counseling repeated/built upon at each appointment.  - The patient admits there is a room for improvement in their diet and drink choices. -  Suggestion is made for the patient to avoid simple carbohydrates from their diet including Cakes, Sweet Desserts / Pastries, Ice Cream, Soda (diet and regular), Sweet Tea, Candies, Chips, Cookies, Sweet Pastries, Store Bought Juices, Alcohol in Excess of 1-2 drinks a day, Artificial Sweeteners, Coffee Creamer, and Sugar-free Products. This will help patient to have stable blood glucose profile and potentially avoid unintended weight gain.   - I encouraged the patient to switch to unprocessed or minimally processed complex starch and increased protein intake (animal or plant source), fruits, and vegetables.   - Patient is advised to stick to a routine mealtimes to eat 3 meals a day and avoid unnecessary snacks (to snack only to correct hypoglycemia).  - I have approached him with the following individualized plan to manage his diabetes and patient agrees:   - he is advised to lower Metformin  to 500 mg po ONCE daily.  Will keep this for now, especially through holiday season, but may try stopping it next visit.  -he is encouraged to start/continue monitoring glucose 1 times daily, before breakfast and to call the clinic if he has readings less than 70 or above 300 for 3 tests in a row.  - Adjustment parameters are  given to him for hypo  and hyperglycemia in writing.  - Specific targets for  A1c; LDL, HDL, and Triglycerides were discussed with the patient.  2) Blood Pressure /Hypertension:  his blood pressure is controlled to target.   he is advised to continue his current medications as prescribed by his PCP.  3) Lipids/Hyperlipidemia:    Review of his recent lipid panel from 07/26/24 showed uncontrolled LDL at 119- improving. He is not currently on any lipid lowering medications but is scheduled to see Dr. Marvine to discuss in the next few weeks.   4)  Weight/Diet:  his Body mass index is 32.96 kg/m.  -  clearly complicating his diabetes care.   he is a candidate for weight loss. I discussed with him the fact that loss of 5 - 10% of his  current body weight will have the most impact on his diabetes management.  Exercise, and detailed carbohydrates information provided  -  detailed on discharge instructions.  5) Chronic Care/Health Maintenance: -he is on ACEI/ARB and Statin medications and is encouraged to initiate and continue to follow up with Ophthalmology, Dentist, Podiatrist at least yearly or according to recommendations, and advised to stay away from smoking. I have recommended yearly flu vaccine and pneumonia vaccine at least every 5 years; moderate intensity exercise for up to 150 minutes weekly; and sleep for at least 7 hours a day.  6) Abnormal TSH- Subclinical hyperthyroidism His recent TSH level in December 2024 was low at 0.006.  He does not have any personal or family history of thyroid  problems, no hx of thyroid  cancer.  He has never had any imaging of his thyroid  in the past.  He denies any Biotin use- has small amount in his mens MVI.  He has never taken any medications for his thyroid  in the past.  Initially he did have some issues with unexplained weight loss (but this was also at the time he was diagnosed with diabetes too).  He notes great improvement in his symptoms.  His antibody testing was negative  ruling out autoimmune thyroid  dysfunction.   His repeat thyroid  labs show suppressed TSH, normal Free T4 and Free T3.  He does not need any antithyroid treatment at this time.   7) Chronic fatigue He notes decreased sex drive thus testosterone  was checked.  This was normal but sex hormone binding globulin was a bit low, likely due to his diabetes.  - he is advised to maintain close follow up with Marvine Rush, MD for primary care needs, as well as his other providers for optimal and coordinated care.     I spent  48  minutes in the care of the patient today including review of labs from CMP, Lipids, Thyroid  Function, Hematology (current and previous including abstractions from other facilities); face-to-face time discussing  his blood glucose readings/logs, discussing hypoglycemia and hyperglycemia episodes and symptoms, medications doses, his options of short and long term treatment based on the latest standards of care / guidelines;  discussion about incorporating lifestyle medicine;  and documenting the encounter. Risk reduction counseling performed per USPSTF guidelines to reduce obesity and cardiovascular risk factors.     Please refer to Patient Instructions for Blood Glucose Monitoring and Insulin/Medications Dosing Guide  in media tab for additional information. Please  also refer to  Patient Self Inventory in the Media  tab for reviewed elements of pertinent patient history.  Anthony Fry participated in the discussions, expressed understanding, and voiced agreement with the above  plans.  All questions were answered to his satisfaction. he is encouraged to contact clinic should he have any questions or concerns prior to his return visit.     Follow up plan: - Return in about 4 months (around 11/30/2024) for Diabetes F/U with A1c in office, No previsit labs.   Benton Rio, [facility] [address] [place], [place] [address] Phone:  [phone] Fax: [phone]  08/01/2024, 4:22 PM

## 2024-12-01 ENCOUNTER — Ambulatory Visit: Admitting: Nurse Practitioner
